# Patient Record
Sex: Female | Born: 1975 | Race: White | Hispanic: No | Marital: Married | Smoking: Never smoker
Health system: Southern US, Community
[De-identification: ages and names within clinical notes are randomized; demographics above are authoritative.]

## PROBLEM LIST (undated history)

## (undated) ENCOUNTER — Inpatient Hospital Stay (HOSPITAL_COMMUNITY): Payer: Self-pay

## (undated) DIAGNOSIS — O09529 Supervision of elderly multigravida, unspecified trimester: Secondary | ICD-10-CM

## (undated) DIAGNOSIS — F32A Depression, unspecified: Secondary | ICD-10-CM

## (undated) DIAGNOSIS — F329 Major depressive disorder, single episode, unspecified: Secondary | ICD-10-CM

## (undated) DIAGNOSIS — F419 Anxiety disorder, unspecified: Secondary | ICD-10-CM

## (undated) DIAGNOSIS — E7212 Methylenetetrahydrofolate reductase deficiency: Secondary | ICD-10-CM

## (undated) DIAGNOSIS — Z1589 Genetic susceptibility to other disease: Secondary | ICD-10-CM

## (undated) HISTORY — PX: OVARIAN CYST REMOVAL: SHX89

## (undated) HISTORY — PX: LAPAROSCOPIC ABDOMINAL EXPLORATION: SHX6249

## (undated) HISTORY — PX: FOOT SURGERY: SHX648

---

## 2009-03-25 ENCOUNTER — Inpatient Hospital Stay (HOSPITAL_COMMUNITY): Admission: AD | Admit: 2009-03-25 | Discharge: 2009-03-25 | Payer: Self-pay | Admitting: Obstetrics

## 2009-04-15 ENCOUNTER — Ambulatory Visit (HOSPITAL_COMMUNITY): Admission: AD | Admit: 2009-04-15 | Discharge: 2009-04-15 | Payer: Self-pay | Admitting: Obstetrics

## 2009-04-15 ENCOUNTER — Encounter (INDEPENDENT_AMBULATORY_CARE_PROVIDER_SITE_OTHER): Payer: Self-pay | Admitting: Obstetrics

## 2009-09-23 ENCOUNTER — Ambulatory Visit (HOSPITAL_COMMUNITY): Admission: RE | Admit: 2009-09-23 | Discharge: 2009-09-23 | Payer: Self-pay | Admitting: Obstetrics and Gynecology

## 2010-07-23 ENCOUNTER — Ambulatory Visit (HOSPITAL_COMMUNITY): Admission: RE | Admit: 2010-07-23 | Discharge: 2010-07-23 | Payer: Self-pay | Admitting: Gastroenterology

## 2011-01-04 LAB — TYPE AND SCREEN: Antibody Screen: NEGATIVE

## 2011-01-05 LAB — ABO/RH: ABO/RH(D): O POS

## 2011-02-10 NOTE — Op Note (Signed)
Haley Church, Haley Church                 ACCOUNT NO.:  [id number]   MEDICAL RECORD NO.:  [id number]          PATIENT TYPE:  AMB   LOCATION:  [facility]                          FACILITY:  [facility]   PHYSICIAN:  Lendon Colonel, MD   DATE OF BIRTH:  [birth year]   DATE OF PROCEDURE:  04/15/2009  DATE OF DISCHARGE:  04/15/2009                               OPERATIVE REPORT   PREOPERATIVE DIAGNOSIS:  A 6-week twin missed abortion.   POSTOPERATIVE DIAGNOSIS:  A 6-week twin missed abortion.   PROCEDURE:  Dilation and curettage.   SURGEON:  Lendon Colonel, MD   ASSISTANT:  None.   ANESTHESIA:  Local with sedation.   FINDINGS:  Adequate POCs, hemostasis postprocedure.   SPECIMENS:  POCs to pathology.   ANTIBIOTICS:  100 mg of IV doxycycline.   ESTIMATED BLOOD LOSS:  Minimal.   COMPLICATIONS:  None.   INDICATIONS:  This is a 35 year old G22, P1 with finding of twin demise  at 6 weeks of ultrasound in the office today.  Risks, benefits, and  alternatives to suction, D and C were discussed with the patient, the  patient elected to proceed.   PROCEDURE IN DETAIL:  After informed consent was obtained, the patient  was taken to the operating room where IV sedation was administered  without difficulty.  She was prepped and draped in the normal sterile  fashion in dorsal supine lithotomy position.  A straight cath was done  for about 200 mL of urine.  A bimanual examination was done to assess  the size and position of the uterus.  Sterile speculum was inserted into  vagina.  Solution of 1% lidocaine was used approximately 2 mL at 12  o'clock in the cervical junction, additional 4 mL each at 5 and 7  o'clock in the cervical paracervical junction.  The tenaculum was used  to grasp the anterior lip of the cervix.  Serial dilation was carried  out to a [employer] and #7-French suction curette was easily inserted  through the cervical canal into the uterus on suction, removal of  moderate amount of POCs  was done.  Two passes were done.  A sharp  curette was used to gently palpate the walls of the uterus.  Some tissue  was noted at the left posterior part of the uterus.  Suction curettage  was carried out one final pass.  Gritty texture was noted with the  suction curette on all walls of the uterus.  The procedure was then  terminated.  Silver nitrate was used on the tenaculum spot.  The patient  was given  some Toradol and some Methergine with plans to continue Methergine  postpartum.  The patient tolerated the procedure well.  Sponge, lap, and  needle counts were correct x3.  The patient was taken to the recovery  room in stable condition.      Lendon Colonel, MD  Electronically Signed     KAF/MEDQ  D:  04/15/2009  T:  04/16/2009  Job:  [id number]

## 2011-10-07 ENCOUNTER — Other Ambulatory Visit (HOSPITAL_BASED_OUTPATIENT_CLINIC_OR_DEPARTMENT_OTHER): Payer: Self-pay | Admitting: Family Medicine

## 2011-10-07 ENCOUNTER — Ambulatory Visit (HOSPITAL_BASED_OUTPATIENT_CLINIC_OR_DEPARTMENT_OTHER)
Admission: RE | Admit: 2011-10-07 | Discharge: 2011-10-07 | Disposition: A | Discharge status: Home / Self Care | Payer: Private Health Insurance - Indemnity | Source: Ambulatory Visit | Attending: Family Medicine | Admitting: Family Medicine

## 2011-10-07 DIAGNOSIS — R52 Pain, unspecified: Secondary | ICD-10-CM

## 2011-10-07 DIAGNOSIS — M25519 Pain in unspecified shoulder: Secondary | ICD-10-CM | POA: Insufficient documentation

## 2012-08-05 LAB — OB RESULTS CONSOLE RUBELLA ANTIBODY, IGM: Rubella: IMMUNE

## 2012-08-19 LAB — OB RESULTS CONSOLE GC/CHLAMYDIA
Chlamydia: NEGATIVE
Gonorrhea: NEGATIVE

## 2012-09-28 NOTE — L&D Delivery Note (Signed)
Delivery Note At 10:05 PM a viable and healthy female was delivered via Vaginal, Spontaneous Delivery (Presentation: Right Occiput Anterior).  APGAR: 9, 9; weight . 7lb 5 oz Placenta status: Intact Abnormal, Manual removal Pathology.   Uterine cavity explored with sponge  stick and manual with any palp retained tissue . Eccentric cord insertion Cord: 3 vessels with the following complications: None.  Cord pH: none  Anesthesia: Epidural  Episiotomy: None Lacerations: None Suture Repair: n/a Est. Blood Loss (mL): 300  Mom to postpartum.  Baby to nursery-stable.  [name] A 03/13/2013, 10:39 PM

## 2013-02-10 ENCOUNTER — Encounter (HOSPITAL_COMMUNITY): Payer: Self-pay

## 2013-02-10 ENCOUNTER — Inpatient Hospital Stay (HOSPITAL_COMMUNITY)
Admission: AD | Admit: 2013-02-10 | Discharge: 2013-02-10 | Disposition: A | Discharge status: Home / Self Care | Payer: 59 | Source: Ambulatory Visit | Attending: Obstetrics & Gynecology | Admitting: Obstetrics & Gynecology

## 2013-02-10 DIAGNOSIS — O99891 Other specified diseases and conditions complicating pregnancy: Secondary | ICD-10-CM | POA: Insufficient documentation

## 2013-02-10 DIAGNOSIS — R030 Elevated blood-pressure reading, without diagnosis of hypertension: Secondary | ICD-10-CM | POA: Insufficient documentation

## 2013-02-10 LAB — URINALYSIS, DIPSTICK ONLY
Bilirubin Urine: NEGATIVE
Leukocytes, UA: NEGATIVE
Nitrite: NEGATIVE
Specific Gravity, Urine: 1.025 (ref 1.005–1.030)
pH: 6 (ref 5.0–8.0)

## 2013-02-10 LAB — CBC
MCV: 88.9 fL (ref 78.0–100.0)
Platelets: 140 10*3/uL — ABNORMAL LOW (ref 150–400)
RDW: 13.9 % (ref 11.5–15.5)
WBC: 10.5 10*3/uL (ref 4.0–10.5)

## 2013-02-10 LAB — COMPREHENSIVE METABOLIC PANEL
AST: 18 U/L (ref 0–37)
Albumin: 2.8 g/dL — ABNORMAL LOW (ref 3.5–5.2)
Chloride: 104 mEq/L (ref 96–112)
Creatinine, Ser: 0.66 mg/dL (ref 0.50–1.10)
Total Bilirubin: 0.3 mg/dL (ref 0.3–1.2)

## 2013-02-10 LAB — PROTEIN / CREATININE RATIO, URINE
Creatinine, Urine: 134.6 mg/dL
Protein Creatinine Ratio: 0.1 (ref 0.00–0.15)
Total Protein, Urine: 13.9 mg/dL

## 2013-02-10 LAB — OB RESULTS CONSOLE GBS: GBS: NEGATIVE

## 2013-02-10 NOTE — MAU Note (Signed)
Was sent by MD due to elevated BP

## 2013-02-10 NOTE — MAU Note (Signed)
Patient given 24 hour container and hat with instructions on how to collect, patient to start collection Sunday morning and end Monday morning with turning this into MD office.

## 2013-02-10 NOTE — MAU Provider Note (Signed)
  History     CSN: [id number]  Arrival date and time: 02/10/13 1450   First Provider Initiated Contact with Patient 02/10/13 1621     Chief Complaint  Patient presents with  . Hypertension   HPI 37 yo, at [redacted] wks gestation, sent from office for serial BP checks and PIH labs since office BP 138/110. Pt presented for routine [facility] visit without any complaints, denies HA, vision changes, RUQ pain. Has LE swelling but resolves with rest. Overall slightly elevated BPs in 120s-occ 130/ 80s-occ 90 range though pregnancy. No h/o HTN.   No past medical history on file.  Past Surgical History  Procedure Laterality Date  . Laparoscopic abdominal exploration    . Foot surgery      No family history on file.  History  Substance Use Topics  . Smoking status: Never Smoker   . Smokeless tobacco: Not on file  . Alcohol Use: No    Allergies:  Allergies  Allergen Reactions  . Percocet (Oxycodone-Acetaminophen) Itching  . Sulfur Rash    Prescriptions prior to admission  Medication Sig Dispense Refill  . aspirin 81 MG tablet Take 81 mg by mouth daily.      . diphenhydramine-acetaminophen (TYLENOL PM) 25-500 MG TABS Take 1 tablet by mouth at bedtime as needed.      . ferrous sulfate 325 (65 FE) MG tablet Take 325 mg by mouth daily with breakfast.      . folic acid (FOLVITE) 1 MG tablet Take 1 mg by mouth daily.      . Prenatal Vit-Fe Fumarate-FA (PRENATAL MULTIVITAMIN) TABS Take 1 tablet by mouth daily at 12 noon.      . pyridoxine (B-6) 100 MG tablet Take 100 mg by mouth daily.        ROS Physical Exam   Blood pressure 138/89, pulse 101, temperature 97.7 F (36.5 C), temperature source Oral, resp. rate 18, height 5\' 9"  (1.753 m), weight 213 lb 6.4 oz (96.798 kg).  Physical Exam A&O x 3, no acute distress. Pleasant Lungs CTA bilat CV RRR, S1S2 normal Abdo soft, non tender, non acute, gravid, non tender uterus Extr no edema/ tenderness. DTR+2/+2 Pelvic deferred FHT  140s/ +  accels/ no decels/ moderate variability, REACTIVE  TOco- none   MAU Course  Procedures  Labs- Nl PIH labs, except slightly low Platelet count.  UA negative protein here.   Assessment and Plan  37 yo, at 35 wks pregnancy, elevated BPs, possible gestational HTN, needs 24 hr urine protein, will drop off in office on [place].  Strict Preeclampsia precautions/ warning s/s. FAC. Labor prec.  Dr Cherly Hensen informed.   Phebe Dettmer R 02/10/2013, 4:29 PM

## 2013-03-10 ENCOUNTER — Other Ambulatory Visit: Payer: Self-pay | Admitting: Obstetrics and Gynecology

## 2013-03-12 ENCOUNTER — Encounter (HOSPITAL_COMMUNITY): Payer: Self-pay

## 2013-03-12 ENCOUNTER — Inpatient Hospital Stay (HOSPITAL_COMMUNITY)
Admission: RE | Admit: 2013-03-12 | Discharge: 2013-03-15 | DRG: 774 | Disposition: A | Discharge status: Home / Self Care | Payer: No Typology Code available for payment source | Source: Ambulatory Visit | Attending: Obstetrics and Gynecology | Admitting: Obstetrics and Gynecology

## 2013-03-12 DIAGNOSIS — E7210 Disorders of sulfur-bearing amino-acid metabolism, unspecified: Secondary | ICD-10-CM | POA: Diagnosis present

## 2013-03-12 DIAGNOSIS — O09529 Supervision of elderly multigravida, unspecified trimester: Secondary | ICD-10-CM | POA: Diagnosis present

## 2013-03-12 DIAGNOSIS — D696 Thrombocytopenia, unspecified: Secondary | ICD-10-CM | POA: Diagnosis present

## 2013-03-12 DIAGNOSIS — D689 Coagulation defect, unspecified: Secondary | ICD-10-CM | POA: Diagnosis present

## 2013-03-12 DIAGNOSIS — O139 Gestational [pregnancy-induced] hypertension without significant proteinuria, unspecified trimester: Principal | ICD-10-CM | POA: Diagnosis present

## 2013-03-12 LAB — URIC ACID: Uric Acid, Serum: 6 mg/dL (ref 2.4–7.0)

## 2013-03-12 LAB — COMPREHENSIVE METABOLIC PANEL
ALT: 15 U/L (ref 0–35)
Alkaline Phosphatase: 182 U/L — ABNORMAL HIGH (ref 39–117)
BUN: 7 mg/dL (ref 6–23)
CO2: 21 mEq/L (ref 19–32)
Chloride: 101 mEq/L (ref 96–112)
GFR calc Af Amer: >90 mL/min (ref >90)
Glucose, Bld: 83 mg/dL (ref 70–99)
Potassium: 3.7 mEq/L (ref 3.5–5.1)
Total Bilirubin: 0.3 mg/dL (ref 0.3–1.2)

## 2013-03-12 LAB — CBC
HCT: 33.8 % — ABNORMAL LOW (ref 36.0–46.0)
Hemoglobin: 11.5 g/dL — ABNORMAL LOW (ref 12.0–15.0)
MCV: 89.9 fL (ref 78.0–100.0)
RBC: 3.76 MIL/uL — ABNORMAL LOW (ref 3.87–5.11)
RDW: 14.6 % (ref 11.5–15.5)
WBC: 11.1 10*3/uL — ABNORMAL HIGH (ref 4.0–10.5)

## 2013-03-12 MED ORDER — OXYTOCIN 10 UNIT/ML IJ SOLN: 10.0000 [IU] | Freq: Once | INTRAMUSCULAR | Status: DC

## 2013-03-12 MED ORDER — OXYTOCIN 40 UNITS IN LACTATED RINGERS INFUSION - SIMPLE MED
1.0000 m[IU]/min | INTRAVENOUS | Status: DC
  Administered 2013-03-13: 2 m[IU]/min via INTRAVENOUS
  Filled 2013-03-12: qty 1000

## 2013-03-12 MED ORDER — ZOLPIDEM TARTRATE 5 MG PO TABS
5.0000 mg | ORAL_TABLET | Freq: Every evening | ORAL | Status: DC | PRN
  Administered 2013-03-12: 5 mg via ORAL
  Filled 2013-03-12: qty 1

## 2013-03-12 MED ORDER — LIDOCAINE HCL (PF) 1 % IJ SOLN
30.0000 mL | INTRAMUSCULAR | Status: DC | PRN
  Filled 2013-03-12 (×2): qty 30

## 2013-03-12 MED ORDER — ONDANSETRON HCL 4 MG/2ML IJ SOLN
4.0000 mg | Freq: Four times a day (QID) | INTRAMUSCULAR | Status: DC | PRN
  Administered 2013-03-13: 4 mg via INTRAVENOUS
  Filled 2013-03-12: qty 2

## 2013-03-12 MED ORDER — IBUPROFEN 600 MG PO TABS
600.0000 mg | ORAL_TABLET | Freq: Four times a day (QID) | ORAL | Status: DC | PRN
  Administered 2013-03-13: 600 mg via ORAL
  Filled 2013-03-12: qty 1

## 2013-03-12 MED ORDER — LACTATED RINGERS IV SOLN
INTRAVENOUS | Status: DC
  Administered 2013-03-12 – 2013-03-13 (×2): via INTRAVENOUS
  Administered 2013-03-13 (×2): 125 mL/h via INTRAVENOUS

## 2013-03-12 MED ORDER — NALBUPHINE SYRINGE 5 MG/0.5 ML
10.0000 mg | INJECTION | INTRAMUSCULAR | Status: DC | PRN
  Administered 2013-03-13 (×2): 10 mg via INTRAVENOUS
  Filled 2013-03-12 (×3): qty 1

## 2013-03-12 MED ORDER — MISOPROSTOL 25 MCG QUARTER TABLET
25.0000 ug | ORAL_TABLET | ORAL | Status: DC | PRN
  Administered 2013-03-12 – 2013-03-13 (×4): 25 ug via VAGINAL
  Filled 2013-03-12: qty 0.25
  Filled 2013-03-12: qty 1
  Filled 2013-03-12 (×2): qty 0.25

## 2013-03-12 MED ORDER — OXYTOCIN 40 UNITS IN LACTATED RINGERS INFUSION - SIMPLE MED
62.5000 mL/h | INTRAVENOUS | Status: DC
  Administered 2013-03-13: 62.5 mL/h via INTRAVENOUS

## 2013-03-12 MED ORDER — OXYTOCIN BOLUS FROM INFUSION: 500.0000 mL | INTRAVENOUS | Status: DC

## 2013-03-12 MED ORDER — TERBUTALINE SULFATE 1 MG/ML IJ SOLN: 0.2500 mg | Freq: Once | INTRAMUSCULAR | Status: AC | PRN

## 2013-03-12 MED ORDER — LACTATED RINGERS IV SOLN: 500.0000 mL | INTRAVENOUS | Status: DC | PRN

## 2013-03-12 MED ORDER — ACETAMINOPHEN 325 MG PO TABS: 650.0000 mg | ORAL_TABLET | ORAL | Status: DC | PRN

## 2013-03-12 MED ORDER — CITRIC ACID-SODIUM CITRATE 334-500 MG/5ML PO SOLN: 30.0000 mL | ORAL | Status: DC | PRN

## 2013-03-12 MED ORDER — OXYCODONE-ACETAMINOPHEN 5-325 MG PO TABS: 1.0000 | ORAL_TABLET | ORAL | Status: DC | PRN

## 2013-03-12 NOTE — H&P (Signed)
Haley Church is a 37 y.o. female presenting for induction due to gestational HTN. (+) leg swelling denies heartburn, visual changes or headache . History OB History   Grav Para Term Preterm Abortions TAB SAB Ect Mult Living   5 1 1  3 1 2   1     SAB x 3 History reviewed. No pertinent past medical history. Past Surgical History  Procedure Laterality Date  . Laparoscopic abdominal exploration    . Foot surgery     Family History: family history is not on file. Social History:  reports that she has never smoked. She does not have any smokeless tobacco history on file. She reports that she does not drink alcohol or use illicit drugs.   Prenatal Transfer Tool  Maternal Diabetes: No Genetic Screening: Normal Maternal Ultrasounds/Referrals: Normal Fetal Ultrasounds or other Referrals:  None Maternal Substance Abuse:  No Significant Maternal Medications:  None Significant Maternal Lab Results:  Lab values include: Group B Strep negative Other Comments:  None MTHFR deficiency. 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 rate 18, height 5\' 9"  (1.753 m), weight 98.884 kg (218 lb). Maternal Exam:  Abdomen: Patient reports no abdominal tenderness. Estimated fetal weight is  7 1/2 lb.   Fetal presentation: vertex  Introitus: Normal vulva. Amniotic fluid character: not assessed.  Pelvis: adequate for delivery.   Cervix: Cervix evaluated by digital exam.     Physical Exam  Constitutional: She is oriented to person, place, and time. She appears well-developed and well-nourished.  HENT:  Head: Atraumatic.  Eyes: EOM are normal.  Neck: Neck supple.  Cardiovascular: Normal rate and regular rhythm.   Respiratory: Breath sounds normal.  GI: Soft.  Musculoskeletal: She exhibits edema.  Neurological: She is alert and oriented to  person, place, and time.  Skin: Skin is warm and dry.  Psychiatric: She has a normal mood and affect.    Prenatal labs: ABO, Rh: O/Positive/-- (11/08 0000) Antibody:  neg Rubella: Immune (11/08 0000) RPR: Nonreactive (04/04 0000)  HBsAg: Negative (11/08 0000)  HIV: Non-reactive (04/04 0000)  GBS: Negative (05/16 0000)   Assessment/Plan: Gestational HTN Term gestation AMA P) admit cytotec induction. PIH labs. Analgesic prn. Pitocin   Haley Church A 03/12/2013, 8:54 PM

## 2013-03-13 ENCOUNTER — Encounter (HOSPITAL_COMMUNITY): Payer: Self-pay | Admitting: Anesthesiology

## 2013-03-13 ENCOUNTER — Encounter (HOSPITAL_COMMUNITY): Payer: Self-pay

## 2013-03-13 ENCOUNTER — Inpatient Hospital Stay (HOSPITAL_COMMUNITY): Payer: No Typology Code available for payment source | Admitting: Anesthesiology

## 2013-03-13 LAB — CBC
HCT: 32.5 % — ABNORMAL LOW (ref 36.0–46.0)
Hemoglobin: 11.1 g/dL — ABNORMAL LOW (ref 12.0–15.0)
MCH: 30.3 pg (ref 26.0–34.0)
MCHC: 34.2 g/dL (ref 30.0–36.0)
MCV: 88.8 fL (ref 78.0–100.0)
Platelets: 125 10*3/uL — ABNORMAL LOW (ref 150–400)
Platelets: 120 10*3/uL — ABNORMAL LOW (ref 150–400)
RBC: 3.76 MIL/uL — ABNORMAL LOW (ref 3.87–5.11)
RBC: 3.66 MIL/uL — ABNORMAL LOW (ref 3.87–5.11)
RDW: 14.2 % (ref 11.5–15.5)
WBC: 12 10*3/uL — ABNORMAL HIGH (ref 4.0–10.5)
WBC: 13.9 10*3/uL — ABNORMAL HIGH (ref 4.0–10.5)

## 2013-03-13 MED ORDER — FENTANYL 2.5 MCG/ML BUPIVACAINE 1/10 % EPIDURAL INFUSION (WH - ANES)
INTRAMUSCULAR | Status: DC | PRN
  Administered 2013-03-13: 15 mL/h via EPIDURAL

## 2013-03-13 MED ORDER — PHENYLEPHRINE 40 MCG/ML (10ML) SYRINGE FOR IV PUSH (FOR BLOOD PRESSURE SUPPORT)
80.0000 ug | PREFILLED_SYRINGE | INTRAVENOUS | Status: DC | PRN
  Filled 2013-03-13: qty 5
  Filled 2013-03-13: qty 2

## 2013-03-13 MED ORDER — LIDOCAINE HCL (PF) 1 % IJ SOLN
INTRAMUSCULAR | Status: DC | PRN
  Administered 2013-03-13: 5 mL
  Administered 2013-03-13: 4 mL

## 2013-03-13 MED ORDER — DIPHENHYDRAMINE HCL 50 MG/ML IJ SOLN
12.5000 mg | INTRAMUSCULAR | Status: DC | PRN
  Administered 2013-03-13 (×2): 12.5 mg via INTRAVENOUS
  Filled 2013-03-13: qty 1

## 2013-03-13 MED ORDER — PHENYLEPHRINE 40 MCG/ML (10ML) SYRINGE FOR IV PUSH (FOR BLOOD PRESSURE SUPPORT)
80.0000 ug | PREFILLED_SYRINGE | INTRAVENOUS | Status: DC | PRN
  Filled 2013-03-13: qty 2

## 2013-03-13 MED ORDER — ACETAMINOPHEN 500 MG PO TABS
1000.0000 mg | ORAL_TABLET | Freq: Four times a day (QID) | ORAL | Status: DC | PRN
  Administered 2013-03-13: 1000 mg via ORAL
  Filled 2013-03-13: qty 2

## 2013-03-13 MED ORDER — LACTATED RINGERS IV SOLN
500.0000 mL | Freq: Once | INTRAVENOUS | Status: AC
  Administered 2013-03-13: 500 mL via INTRAVENOUS

## 2013-03-13 MED ORDER — EPHEDRINE 5 MG/ML INJ
10.0000 mg | INTRAVENOUS | Status: DC | PRN
  Filled 2013-03-13: qty 2

## 2013-03-13 MED ORDER — OXYTOCIN 40 UNITS IN LACTATED RINGERS INFUSION - SIMPLE MED
1.0000 m[IU]/min | INTRAVENOUS | Status: DC
  Administered 2013-03-13: 14 m[IU]/min via INTRAVENOUS

## 2013-03-13 MED ORDER — EPHEDRINE 5 MG/ML INJ
10.0000 mg | INTRAVENOUS | Status: DC | PRN
  Filled 2013-03-13: qty 4
  Filled 2013-03-13: qty 2

## 2013-03-13 MED ORDER — FENTANYL 2.5 MCG/ML BUPIVACAINE 1/10 % EPIDURAL INFUSION (WH - ANES)
14.0000 mL/h | INTRAMUSCULAR | Status: DC | PRN
  Administered 2013-03-13: 14 mL/h via EPIDURAL
  Filled 2013-03-13 (×2): qty 125

## 2013-03-13 NOTE — Progress Notes (Signed)
IV saline locked pt up for shower

## 2013-03-13 NOTE — Progress Notes (Signed)
S: notes  Intermittent rectal pressure  O;  T 99.7 ( ax) tracing reactive  Baseline 130 Ctx q 2-3 mins VE: fully (+) 2 station  IMP" complete Term gestation Maternal fever prob 2nd to dehydration < 12 hr AROM P) start pushing. Tylenol for fever

## 2013-03-13 NOTE — Anesthesia Procedure Notes (Signed)
Epidural Patient location during procedure: OB Start time: 03/13/2013 2:13 PM  Staffing Anesthesiologist: Terin Cragle A. Performed by: anesthesiologist   Preanesthetic Checklist Completed: patient identified, site marked, surgical consent, pre-op evaluation, timeout performed, IV checked, risks and benefits discussed and monitors and equipment checked  Epidural Patient position: sitting Prep: site prepped and draped and DuraPrep Patient monitoring: continuous pulse ox and blood pressure Approach: midline Injection technique: LOR air  Needle:  Needle type: Tuohy  Needle gauge: 17 G Needle length: 9 cm and 9 Needle insertion depth: 5 cm cm Catheter type: closed end flexible Catheter size: 19 Gauge Catheter at skin depth: 10 cm Test dose: negative and Other  Assessment Events: blood not aspirated, injection not painful, no injection resistance, negative IV test and no paresthesia  Additional Notes Patient identified. Risks and benefits discussed including failed block, incomplete  Pain control, post dural puncture headache, nerve damage, paralysis, blood pressure Changes, nausea, vomiting, reactions to medications-both toxic and allergic and post Partum back pain. All questions were answered. Patient expressed understanding and wished to proceed. Sterile technique was used throughout procedure. Epidural site was Dressed with sterile barrier dressing. No paresthesias, signs of intravascular injection Or signs of intrathecal spread were encountered.  Patient was more comfortable after the epidural was dosed. Please see RN's note for documentation of vital signs and FHR which are stable.

## 2013-03-13 NOTE — Progress Notes (Signed)
S: no complaint  O: BP 130/91 Epidural Pitocin 8 MIU  VE: 3/70/-3 asynclytic AROM clear fluid IUPC inserted  plt ct 125  Tracing: baseline 130 (+) accelx ? Ctx freq(   IMP: Latent phase Gest HTN Thrombocytopenia P) cont with pitocin, exaggerated right sims

## 2013-03-13 NOTE — Anesthesia Preprocedure Evaluation (Signed)

## 2013-03-13 NOTE — Progress Notes (Signed)
Cheryle Pieper is a 37 y.o. [id number] at [redacted]w[redacted]d by ultrasound admitted for induction of labor due to gestational HTN.  Subjective: No chief complaint on file. c/o painful ctx  Objective: BP 136/76  Pulse 91  Temp(Src) 97.7 F (36.5 C) (Axillary)  Resp 20  Ht 5\' 9"  (1.753 m)  Wt 98.884 kg (218 lb)  BMI 32.18 kg/m2      FHT:  FHR: 130 bpm, variability: moderate,  accelerations:  Present,  decelerations:  Absent UC:   irregular, every 2-4 minutes SVE:  loose 1 cm dilated, 70% effaced, -3 station Tracing: cat 1  Labs: Lab Results  Component Value Date   WBC 11.1* 03/12/2013   HGB 11.5* 03/12/2013   HCT 33.8* 03/12/2013   MCV 89.9 03/12/2013   PLT 148* 03/12/2013    Assessment / Plan: latent phase Gestational HTN Term P) epidural start pitocin rpt CBC  Anticipated MOD:  NSVD  Aricela Bertagnolli A 03/13/2013, 1:08 PM

## 2013-03-13 NOTE — Progress Notes (Signed)
Haley Church is a 37 y.o. [id number] at [redacted]w[redacted]d by ultrasound admitted for induction of labor due to gestational HTN.  Subjective: No chief complaint on file.  C/o painful ctx. S/p cytotec x3 Objective: BP 136/76  Pulse 91  Temp(Src) 97.7 F (36.5 C) (Axillary)  Resp 20  Ht 5\' 9"  (1.753 m)  Wt 98.884 kg (218 lb)  BMI 32.18 kg/m2      FHT:  FHR: 130 bpm, variability: moderate,  accelerations:  Present,  decelerations:  Absent UC:   irregular, every 2-4 minutes SVE:   1 cm dilated, 50% effaced, -3 station Tracing: reactive cat 1  Labs: Lab Results  Component Value Date   WBC 11.1* 03/12/2013   HGB 11.5* 03/12/2013   HCT 33.8* 03/12/2013   MCV 89.9 03/12/2013   PLT 148* 03/12/2013    Assessment / Plan: latent phase Gestational HTN Term P) cont with cytotec  Anticipated MOD:  NSVD  Haley Church A 03/13/2013, 1:07 PM

## 2013-03-14 LAB — CBC
HCT: 29.8 % — ABNORMAL LOW (ref 36.0–46.0)
Hemoglobin: 10.2 g/dL — ABNORMAL LOW (ref 12.0–15.0)
RDW: 14.4 % (ref 11.5–15.5)
WBC: 13.6 10*3/uL — ABNORMAL HIGH (ref 4.0–10.5)

## 2013-03-14 LAB — COMPREHENSIVE METABOLIC PANEL
ALT: 13 U/L (ref 0–35)
AST: 28 U/L (ref 0–37)
Alkaline Phosphatase: 134 U/L — ABNORMAL HIGH (ref 39–117)
CO2: 20 mEq/L (ref 19–32)
GFR calc Af Amer: >90 mL/min (ref >90)
Glucose, Bld: 117 mg/dL — ABNORMAL HIGH (ref 70–99)
Potassium: 3.4 mEq/L — ABNORMAL LOW (ref 3.5–5.1)
Sodium: 134 mEq/L — ABNORMAL LOW (ref 135–145)
Total Protein: 4.7 g/dL — ABNORMAL LOW (ref 6.0–8.3)

## 2013-03-14 LAB — URIC ACID: Uric Acid, Serum: 6.4 mg/dL (ref 2.4–7.0)

## 2013-03-14 MED ORDER — SIMETHICONE 80 MG PO CHEW: 80.0000 mg | CHEWABLE_TABLET | ORAL | Status: DC | PRN

## 2013-03-14 MED ORDER — DIPHENHYDRAMINE HCL 25 MG PO CAPS: 25.0000 mg | ORAL_CAPSULE | Freq: Four times a day (QID) | ORAL | Status: DC | PRN

## 2013-03-14 MED ORDER — TETANUS-DIPHTH-ACELL PERTUSSIS 5-2.5-18.5 LF-MCG/0.5 IM SUSP
0.5000 mL | Freq: Once | INTRAMUSCULAR | Status: AC
  Administered 2013-03-14: 0.5 mL via INTRAMUSCULAR
  Filled 2013-03-14: qty 0.5

## 2013-03-14 MED ORDER — BENZOCAINE-MENTHOL 20-0.5 % EX AERO: 1.0000 "application " | INHALATION_SPRAY | CUTANEOUS | Status: DC | PRN

## 2013-03-14 MED ORDER — FERROUS SULFATE 325 (65 FE) MG PO TABS
325.0000 mg | ORAL_TABLET | Freq: Two times a day (BID) | ORAL | Status: DC
  Administered 2013-03-14 – 2013-03-15 (×3): 325 mg via ORAL
  Filled 2013-03-14 (×3): qty 1

## 2013-03-14 MED ORDER — ONDANSETRON HCL 4 MG PO TABS: 4.0000 mg | ORAL_TABLET | ORAL | Status: DC | PRN

## 2013-03-14 MED ORDER — PRENATAL MULTIVITAMIN CH: 1.0000 | ORAL_TABLET | Freq: Every day | ORAL | Status: DC

## 2013-03-14 MED ORDER — TRAMADOL HCL 50 MG PO TABS
50.0000 mg | ORAL_TABLET | Freq: Four times a day (QID) | ORAL | Status: DC | PRN
  Administered 2013-03-14 – 2013-03-15 (×3): 50 mg via ORAL
  Filled 2013-03-14 (×3): qty 1

## 2013-03-14 MED ORDER — LANOLIN HYDROUS EX OINT: TOPICAL_OINTMENT | CUTANEOUS | Status: DC | PRN

## 2013-03-14 MED ORDER — DIBUCAINE 1 % RE OINT: 1.0000 "application " | TOPICAL_OINTMENT | RECTAL | Status: DC | PRN

## 2013-03-14 MED ORDER — IBUPROFEN 600 MG PO TABS
600.0000 mg | ORAL_TABLET | Freq: Four times a day (QID) | ORAL | Status: DC
  Administered 2013-03-14 – 2013-03-15 (×4): 600 mg via ORAL
  Filled 2013-03-14 (×5): qty 1

## 2013-03-14 MED ORDER — SENNOSIDES-DOCUSATE SODIUM 8.6-50 MG PO TABS
2.0000 | ORAL_TABLET | Freq: Every day | ORAL | Status: DC
  Administered 2013-03-14: 2 via ORAL

## 2013-03-14 MED ORDER — ZOLPIDEM TARTRATE 5 MG PO TABS: 5.0000 mg | ORAL_TABLET | Freq: Every evening | ORAL | Status: DC | PRN

## 2013-03-14 MED ORDER — ONDANSETRON HCL 4 MG/2ML IJ SOLN: 4.0000 mg | INTRAMUSCULAR | Status: DC | PRN

## 2013-03-14 MED ORDER — WITCH HAZEL-GLYCERIN EX PADS: 1.0000 "application " | MEDICATED_PAD | CUTANEOUS | Status: DC | PRN

## 2013-03-14 NOTE — Anesthesia Postprocedure Evaluation (Signed)
  Anesthesia Post-op Note  Patient: Haley Church  Procedure(s) Performed: * No procedures listed *  Patient Location: Mother/Baby  Anesthesia Type:Epidural  Level of Consciousness: awake, alert , oriented and patient cooperative  Airway and Oxygen Therapy: Patient Spontanous Breathing  Post-op Pain: mild  Post-op Assessment: Patient's Cardiovascular Status Stable, Respiratory Function Stable, No headache, No backache, No residual numbness and No residual motor weakness  Post-op Vital Signs: stable  Complications: No apparent anesthesia complications

## 2013-03-14 NOTE — Progress Notes (Signed)
PPD 1 SVD  S:  Reports feeling ok             Tolerating po/ No nausea or vomiting             Bleeding is light             Pain controlled with motrin only (hx rash with codone based meds - no problem with demerol in past)             Up ad lib / ambulatory / voiding QS  Newborn breast feeding  O:               VS: BP 124/62  Pulse 93  Temp(Src) 98.2 F (36.8 C) (Oral)  Resp 18  Ht 5\' 9"  (1.753 m)  Wt 98.884 kg (218 lb)  BMI 32.18 kg/m2  SpO2 99%   LABS:  Recent Labs  03/13/13 2310 03/14/13 0623  WBC 13.9* 13.6*  HGB 11.1* 10.2*  PLT 120* 104*                                  Physical Exam:             Alert and oriented X3  Lungs: Clear and unlabored  Heart: regular rate and rhythm / no mumurs  Abdomen: soft, non-tender, non-distended              Fundus: firm, non-tender, Ueven  Perineum: marked edema - ice pack in place  Lochia: light  Extremities: no edema, no calf pain or tenderness    A: PPD # 1 SVD   Doing well - stable status  P:  Routine post partum orders              Try Ultram for pain    Marlinda Mike CNM, MSN, [facility] 03/14/2013, 9:06 AM

## 2013-03-15 MED ORDER — TRAMADOL HCL 50 MG PO TABS: 50.0000 mg | ORAL_TABLET | Freq: Four times a day (QID) | ORAL | Status: DC | PRN

## 2013-03-15 MED ORDER — DOCUSATE SODIUM 100 MG PO CAPS: 100.0000 mg | ORAL_CAPSULE | Freq: Two times a day (BID) | ORAL | Status: DC | PRN

## 2013-03-15 NOTE — Lactation Note (Signed)
This note was copied from the chart of Girl [employer]. Lactation Consultation Note Mom breast feeding baby on the left side cradle hold when I enter room, baby gulping, mom states comfortable.  Mom states br feeding has been going well, baby cluster fed last night and mom states that staff reassured her that baby is doing well and formula was not necessary.  Mom had many questions, discussed at length: cluster feeding, how to know baby is getting enough, safe sleep, br feeding basics.  Enc mom to call lactation office if she has any concerns, and to attend the BFSG. Questions answered.    Patient Name: Girl Aslynn Brunetti OZHYQ'M Date: 03/15/2013 Reason for consult: Follow-up assessment   Maternal Data    Feeding Feeding Type: Breast Milk Feeding method: Breast Length of feed: 15 min  LATCH Score/Interventions Latch: Grasps breast easily, tongue down, lips flanged, rhythmical sucking.  Audible Swallowing: Spontaneous and intermittent  Type of Nipple: Everted at rest and after stimulation  Comfort (Breast/Nipple): Soft / non-tender     Hold (Positioning): No assistance needed to correctly position infant at breast. Intervention(s): Skin to skin  LATCH Score: 10  Lactation Tools Discussed/Used     Consult Status Consult Status: Complete    Lenard Forth 03/15/2013, 10:53 AM

## 2013-03-15 NOTE — Progress Notes (Signed)
Patient ID: Haley Church, female   DOB: [birth year], 37 y.o.   MRN: [id number] PPD # 2  Subjective: Pt reports feeling well and eager for d/c home/ Pain controlled with Ultram Tolerating po/ Voiding without problems/ No n/v Bleeding is light/ Newborn info:  Information for the patient's newborn:  Haley, Church [[id number]]  female Feeding: breast    Objective:  VS: Blood pressure 123/59, pulse 86, temperature 98.4 F (36.9 C), temperature source Oral, resp. rate 19.    Recent Labs  03/13/13 2310 03/14/13 0623  WBC 13.9* 13.6*  HGB 11.1* 10.2*  HCT 32.5* 29.8*  PLT 120* 104*    Blood type: O/Positive/-- (11/08 0000) Rubella: Immune (11/08 0000)    Physical Exam:  General:  alert, cooperative and no distress CV: Regular rate and rhythm Resp: clear Abdomen: soft, nontender, normal bowel sounds Uterine Fundus: firm, below umbilicus, nontender Perineum: intact; no edema Lochia: minimal Ext: Trace edema and Homans sign is negative, no sign of DVT    A/P: PPD # 2/ G6 P2042/ S/P: SVD Doing well and stable for discharge home RX:  Ultram 50mg  Q6 hrs prn Colace 100 mg BID prn #30 Ref x 1 WOB/GYN booklet given Routine pp visit in 6wks   Demetrius Revel, MSN, [facility] 03/15/2013, 9:54 AM

## 2013-03-15 NOTE — Discharge Summary (Signed)
Obstetric Discharge Summary Reason for Admission: G6 P 2 0 4 2 @ 39wks for IOL d/t gestHTN, MAM, MTHFR Prenatal Procedures: NST and ultrasound Intrapartum Procedures: spontaneous vaginal delivery Postpartum Procedures: none Complications-Operative and Postpartum: none Hemoglobin  Date Value Range Status  03/14/2013 10.2* 12.0 - 15.0 g/dL Final     HCT  Date Value Range Status  03/14/2013 29.8* 36.0 - 46.0 % Final    Physical Exam:  General: alert, cooperative and no distress Lochia: appropriate Uterine Fundus: firm Incision: na DVT Evaluation: No evidence of DVT seen on physical exam. Negative Homan's sign.  Discharge Diagnoses: S/P SVD @ 39wks; gestHTN resolved; hx MTHFR; AMA  Discharge Information: Date: 03/15/2013 Activity: pelvic rest Diet: routine Medications: PNV, Colace and Ultram Condition: stable Instructions: refer to practice specific booklet Discharge to: home Follow-up Information   Follow up with [place] Lapaglia A, MD In 6 weeks.   Contact information:   [address] [place] [address] [phone]       Newborn Data: Live born female on 03/13/13 Birth Weight: 7 lb 5.5 oz (3330 g) APGAR: 9, 9  Home with mother.  FISHER,JULIE K 03/15/2013, 10:02 AM

## 2013-10-03 ENCOUNTER — Ambulatory Visit (INDEPENDENT_AMBULATORY_CARE_PROVIDER_SITE_OTHER): Payer: BC Managed Care – PPO | Admitting: Sports Medicine

## 2013-10-03 ENCOUNTER — Encounter: Payer: Self-pay | Admitting: Sports Medicine

## 2013-10-03 VITALS — BP 135/89 | HR 105 | Wt 188.0 lb

## 2013-10-03 DIAGNOSIS — M25512 Pain in left shoulder: Secondary | ICD-10-CM

## 2013-10-03 DIAGNOSIS — M7550 Bursitis of unspecified shoulder: Secondary | ICD-10-CM | POA: Insufficient documentation

## 2013-10-03 DIAGNOSIS — M25519 Pain in unspecified shoulder: Secondary | ICD-10-CM

## 2013-10-03 MED ORDER — MELOXICAM 15 MG PO TABS: ORAL_TABLET | ORAL | Status: DC

## 2013-10-03 NOTE — Assessment & Plan Note (Signed)
Symptoms are likely due to rotator cuff dysfunction. Subacromial injection, Mobic, formal physical therapy. Return in one month.

## 2013-10-03 NOTE — Progress Notes (Signed)
   Subjective:    I'm seeing this patient as a consultation for:  Tandy GawJade Breeback, PA-C  CC: Left shoulder pain  HPI: This is a very pleasant 38 year old female, she comes in with a several week history of pain which he localizes over the anterior and lateral aspect of her left shoulder, worse with overhead activities, flexion and internal rotation of the shoulder. She denies any trauma however she has been doing insanity workout. Pain is moderate, persistent, no radiation.  Past medical history, Surgical history, Family history not pertinant except as noted below, Social history, Allergies, and medications have been entered into the medical record, reviewed, and no changes needed.   Review of Systems: No headache, visual changes, nausea, vomiting, diarrhea, constipation, dizziness, abdominal pain, skin rash, fevers, chills, night sweats, weight loss, swollen lymph nodes, body aches, joint swelling, muscle aches, chest pain, shortness of breath, mood changes, visual or auditory hallucinations.   Objective:   General: Well Developed, well nourished, and in no acute distress.  Neuro/Psych: Alert and oriented x3, extra-ocular muscles intact, able to move all 4 extremities, sensation grossly intact. Skin: Warm and dry, no rashes noted.  Respiratory: Not using accessory muscles, speaking in full sentences, trachea midline.  Cardiovascular: Pulses palpable, no extremity edema. Abdomen: Does not appear distended. Left Shoulder: Inspection reveals no abnormalities, atrophy or asymmetry. Palpation is normal with no tenderness over AC joint or bicipital groove. ROM is full in all planes. Rotator cuff strength normal throughout. Positive Neer's, HAWKINS, Empty Can signs. Positive speed test, negative Yergason test No labral pathology noted with negative Obrien's, negative clunk and good stability. Normal scapular function observed. No painful arc and no drop arm sign. No apprehension  sign  Procedure: Real-time Ultrasound Guided Injection of left subacromial bursa Device: GE Logiq E  Verbal informed consent obtained.  Time-out conducted.  Noted no overlying erythema, induration, or other signs of local infection.  Skin prepped in a sterile fashion.  Local anesthesia: Topical Ethyl chloride.  With sterile technique and under real time ultrasound guidance: Noted moderate bursitis, 1 cc Kenalog 40, 4 cc lidocaine injected easily.  Completed without difficulty  Pain immediately resolved suggesting accurate placement of the medication.  Advised to call if fevers/chills, erythema, induration, drainage, or persistent bleeding.  Images permanently stored and available for review in the ultrasound unit.  Impression: Technically successful ultrasound guided injection.  Impression and Recommendations:   This case required medical decision making of moderate complexity.

## 2013-10-04 ENCOUNTER — Ambulatory Visit: Payer: PRIVATE HEALTH INSURANCE | Admitting: Physician Assistant

## 2013-10-06 ENCOUNTER — Ambulatory Visit (INDEPENDENT_AMBULATORY_CARE_PROVIDER_SITE_OTHER): Payer: BC Managed Care – PPO | Admitting: Physician Assistant

## 2013-10-06 ENCOUNTER — Encounter: Payer: Self-pay | Admitting: Physician Assistant

## 2013-10-06 VITALS — BP 136/80 | HR 97 | Ht 69.0 in | Wt 188.0 lb

## 2013-10-06 DIAGNOSIS — F900 Attention-deficit hyperactivity disorder, predominantly inattentive type: Secondary | ICD-10-CM | POA: Insufficient documentation

## 2013-10-06 DIAGNOSIS — F909 Attention-deficit hyperactivity disorder, unspecified type: Secondary | ICD-10-CM

## 2013-10-06 MED ORDER — METHYLPHENIDATE HCL ER (OSM) 36 MG PO TBCR: 36.0000 mg | EXTENDED_RELEASE_TABLET | Freq: Every day | ORAL | Status: DC

## 2013-10-06 NOTE — Progress Notes (Signed)
   Subjective:    Patient ID: Haley MeuseAmber Church, female    DOB: October 07, 1975, 38 y.o.   MRN: [id number]  HPI Patient is a 38 year old white female who presents to the clinic to establish care. Patient has no ongoing past medical history. She was diagnosed with ADHD since childhood. She remains on Concerta 36 mg daily. She feels very well controlled on this dose. 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 This allows her to complete tasks around the house. [facility] and would like to get something closer to home. She denies any palpitations, anorexia or insomnia with Concerta.  Review of Systems     Objective:   Physical Exam  Constitutional: She is oriented to person, place, and time. She appears well-developed and well-nourished.  HENT:  Head: Normocephalic and atraumatic.  Cardiovascular: Normal rate, regular rhythm and normal heart sounds.   Pulmonary/Chest: Effort normal and breath sounds normal.  Neurological: She is alert and oriented to person, place, and time.  Skin: Skin is warm and dry.  Psychiatric: She has a normal mood and affect. Her behavior is normal.          Assessment & Plan:  ADHD-refilled Concerta for 3 months. Followup for complete physical and lower in 3 months.

## 2013-10-10 ENCOUNTER — Ambulatory Visit (INDEPENDENT_AMBULATORY_CARE_PROVIDER_SITE_OTHER): Payer: BC Managed Care – PPO | Admitting: Physical Therapy

## 2013-10-10 DIAGNOSIS — M25519 Pain in unspecified shoulder: Secondary | ICD-10-CM

## 2013-10-10 DIAGNOSIS — R293 Abnormal posture: Secondary | ICD-10-CM

## 2013-10-31 ENCOUNTER — Encounter: Payer: Self-pay | Admitting: Sports Medicine

## 2013-10-31 ENCOUNTER — Ambulatory Visit (INDEPENDENT_AMBULATORY_CARE_PROVIDER_SITE_OTHER): Payer: BC Managed Care – PPO | Admitting: Sports Medicine

## 2013-10-31 VITALS — BP 135/89 | HR 115 | Ht 69.0 in | Wt 182.0 lb

## 2013-10-31 DIAGNOSIS — M25512 Pain in left shoulder: Secondary | ICD-10-CM

## 2013-10-31 DIAGNOSIS — M79609 Pain in unspecified limb: Secondary | ICD-10-CM

## 2013-10-31 DIAGNOSIS — M79672 Pain in left foot: Secondary | ICD-10-CM

## 2013-10-31 DIAGNOSIS — M722 Plantar fascial fibromatosis: Secondary | ICD-10-CM | POA: Insufficient documentation

## 2013-10-31 DIAGNOSIS — M7520 Bicipital tendinitis, unspecified shoulder: Secondary | ICD-10-CM

## 2013-10-31 DIAGNOSIS — M25519 Pain in unspecified shoulder: Secondary | ICD-10-CM

## 2013-10-31 DIAGNOSIS — M7522 Bicipital tendinitis, left shoulder: Secondary | ICD-10-CM

## 2013-10-31 NOTE — Progress Notes (Signed)
Subjective:    CC: Followup  HPI: Left shoulder pain: Approximately seventy-eight % improvement after subacromial injection at the last visit. She continues to have some pain that she localizes over the acromioclavicular joint, worse with picking up her child and worse with overhead activities. Mild, persistent. No paresthesias, no new trauma.  Past medical history, Surgical history, Family history not pertinant except as noted below, Social history, Allergies, and medications have been entered into the medical record, reviewed, and no changes needed.   Review of Systems: No fevers, chills, night sweats, weight loss, chest pain, or shortness of breath.   Objective:    General: Well Developed, well nourished, and in no acute distress.  Neuro: Alert and oriented x3, extra-ocular muscles intact, sensation grossly intact.  HEENT: Normocephalic, atraumatic, pupils equal round reactive to light, neck supple, no masses, no lymphadenopathy, thyroid nonpalpable.  Skin: Warm and dry, no rashes. Cardiac: Regular rate and rhythm, no murmurs rubs or gallops, no lower extremity edema.  Respiratory: Clear to auscultation bilaterally. Not using accessory muscles, speaking in full sentences. Left Shoulder: Inspection reveals no abnormalities, atrophy or asymmetry. ROM is full in all planes. Rotator cuff strength normal throughout. Positive Neer's, Hawkin's, empty can sign. Positive cross arm test with tenderness over the acromioclavicular joint. Positive speed test, negative Yergason test. No labral pathology noted with negative Obrien's, negative clunk and good stability. Normal scapular function observed. No painful arc and no drop arm sign. No apprehension sign  Procedure: Real-time Ultrasound Guided Injection of left acromioclavicular joint Device: GE Logiq E  Verbal informed consent obtained.  Time-out conducted.  Noted no overlying erythema, induration, or other signs of local infection.    Skin prepped in a sterile fashion.  Local anesthesia: Topical Ethyl chloride.  With sterile technique and under real time ultrasound guidance:  0.5 cc Kenalog 40, 1 cc lidocaine injected easily into the acromioclavicular joint. Completed without difficulty  Pain immediately resolved suggesting accurate placement of the medication.  Advised to call if fevers/chills, erythema, induration, drainage, or persistent bleeding.  Images permanently stored and available for review in the ultrasound unit.  Impression: Technically successful ultrasound guided injection.  Procedure: Real-time Ultrasound Guided Injection of left subacromial bursa Device: GE Logiq E  Verbal informed consent obtained.  Time-out conducted.  Noted no overlying erythema, induration, or other signs of local infection.  Skin prepped in a sterile fashion.  Local anesthesia: Topical Ethyl chloride.  With sterile technique and under real time ultrasound guidance:  0.5 cc Kenalog 40, 3 cc lidocaine injected easily into the subacromial bursa. Completed without difficulty  Pain immediately resolved suggesting accurate placement of the medication.  Advised to call if fevers/chills, erythema, induration, drainage, or persistent bleeding.  Images permanently stored and available for review in the ultrasound unit.  Impression: Technically successful ultrasound guided injection.  Procedure: Real-time Ultrasound Guided Injection of left biceps tendon sheath Device: GE Logiq E  Verbal informed consent obtained.  Time-out conducted.  Noted no overlying erythema, induration, or other signs of local infection.  Skin prepped in a sterile fashion.  Local anesthesia: Topical Ethyl chloride.  With sterile technique and under real time ultrasound guidance:  1 cc Kenalog 40, 3 cc lidocaine injected easily into the biceps tendon sheath in the bicipital groove taking care to avoid intratendinous injection. Completed without difficulty  Pain  immediately resolved suggesting accurate placement of the medication.  Advised to call if fevers/chills, erythema, induration, drainage, or persistent bleeding.  Images permanently stored and available for  review in the ultrasound unit.  Impression: Technically successful ultrasound guided injection.  Impression and Recommendations:

## 2013-10-31 NOTE — Assessment & Plan Note (Signed)
Greatly improved after initial subacromial injection one month ago. She continues to have some impingement symptoms but also has pain referable to the acromioclavicular joint as well as the biceps tendon sheath with a positive cross arm test and a positive speed test. Today we blocked her subacromial bursa, acromioclavicular joint, and biceps tendon sheath under guidance. Return to see me in one month.

## 2013-10-31 NOTE — Assessment & Plan Note (Signed)
Localized under her arch and while exercising. Over-the-counter inserts were ineffective. Return to see me at my next available slot for custom orthotics.

## 2013-11-02 ENCOUNTER — Ambulatory Visit (INDEPENDENT_AMBULATORY_CARE_PROVIDER_SITE_OTHER): Payer: BC Managed Care – PPO | Admitting: Sports Medicine

## 2013-11-02 ENCOUNTER — Encounter: Payer: Self-pay | Admitting: Sports Medicine

## 2013-11-02 VITALS — BP 135/83 | HR 93 | Ht 69.0 in | Wt 183.0 lb

## 2013-11-02 DIAGNOSIS — M79672 Pain in left foot: Secondary | ICD-10-CM

## 2013-11-02 DIAGNOSIS — B351 Tinea unguium: Secondary | ICD-10-CM

## 2013-11-02 DIAGNOSIS — M79609 Pain in unspecified limb: Secondary | ICD-10-CM

## 2013-11-02 MED ORDER — TERBINAFINE HCL 250 MG PO TABS: 250.0000 mg | ORAL_TABLET | Freq: Every day | ORAL | Status: AC

## 2013-11-02 NOTE — Assessment & Plan Note (Signed)
Lamisil for 3-6 months. 

## 2013-11-02 NOTE — Assessment & Plan Note (Signed)
Orthotics as above. 

## 2013-11-02 NOTE — Progress Notes (Signed)
    Patient was fitted for a : standard, cushioned, semi-rigid orthotic. The orthotic was heated and afterward the patient stood on the orthotic blank positioned on the orthotic stand. The patient was positioned in subtalar neutral position and 10 degrees of ankle dorsiflexion in a weight bearing stance. After completion of molding, a stable base was applied to the orthotic blank. The blank was ground to a stable position for weight bearing. Size: 9 Base: Blue EVA Additional Posting and Padding: None The patient ambulated these, and they were very comfortable.  I spent 40 minutes with this patient, greater than 50% was face-to-face time counseling regarding the below diagnosis.   

## 2013-11-03 ENCOUNTER — Telehealth: Payer: Self-pay | Admitting: *Deleted

## 2013-11-03 NOTE — Telephone Encounter (Signed)
Yes appt is needed in order to document medication dose increase.

## 2013-11-03 NOTE — Telephone Encounter (Signed)
Pt notified & was transferred to scheduling. 

## 2013-11-03 NOTE — Telephone Encounter (Signed)
Pt left message stating that she thinks the strength of adhd med she's on isn't as effective as it used to be.  She wants to know if you could possibly increase it.  Need appt first?

## 2013-11-06 ENCOUNTER — Encounter: Payer: Self-pay | Admitting: Physician Assistant

## 2013-11-06 ENCOUNTER — Ambulatory Visit (INDEPENDENT_AMBULATORY_CARE_PROVIDER_SITE_OTHER): Payer: BC Managed Care – PPO | Admitting: Physician Assistant

## 2013-11-06 VITALS — BP 126/78 | HR 90 | Wt 183.0 lb

## 2013-11-06 DIAGNOSIS — F909 Attention-deficit hyperactivity disorder, unspecified type: Secondary | ICD-10-CM

## 2013-11-06 DIAGNOSIS — M7522 Bicipital tendinitis, left shoulder: Secondary | ICD-10-CM | POA: Insufficient documentation

## 2013-11-06 MED ORDER — METHYLPHENIDATE HCL ER (OSM) 54 MG PO TBCR: 54.0000 mg | EXTENDED_RELEASE_TABLET | ORAL | Status: DC

## 2013-11-06 NOTE — Progress Notes (Signed)
   Subjective:    Patient ID: Haley MeuseAmber Church, female    DOB: November 26, 1975, 38 y.o.   MRN: [id number]  HPI Patient is a 38 year old female who presents to the clinic to discuss ADHD. She feels like her current dose of Concerta 36 mg daily if not as effective as it once was. She's been on this dose for a couple of years now she just recently restarted after having a baby but thought it would be adequate. She is noticing she is not able to complete tasks when she starts how to complete. She struggles to maintain a daily activities of her house. She would like to increase and see if that helped with symptoms. Patient denies any side effects of insomnia, anorexia, palpitations.    Review of Systems     Objective:   Physical Exam  Constitutional: She is oriented to person, place, and time. She appears well-developed and well-nourished.  HENT:  Head: Normocephalic and atraumatic.  Cardiovascular: Normal rate, regular rhythm and normal heart sounds.   Pulmonary/Chest: Effort normal and breath sounds normal.  Neurological: She is alert and oriented to person, place, and time.  Skin: Skin is dry.  Psychiatric: She has a normal mood and affect. Her behavior is normal.          Assessment & Plan:  ADHD-will increase Concerta to 54 mg daily. Reminded patient of side effects and to call if she had any. Will followup in 2 months.

## 2013-11-06 NOTE — Assessment & Plan Note (Signed)
Biceps tendon sheath injection as above.

## 2013-11-27 ENCOUNTER — Ambulatory Visit (INDEPENDENT_AMBULATORY_CARE_PROVIDER_SITE_OTHER): Payer: BC Managed Care – PPO | Admitting: Physician Assistant

## 2013-11-27 ENCOUNTER — Encounter: Payer: Self-pay | Admitting: Physician Assistant

## 2013-11-27 VITALS — BP 150/94 | HR 109 | Wt 180.0 lb

## 2013-11-27 DIAGNOSIS — R21 Rash and other nonspecific skin eruption: Secondary | ICD-10-CM

## 2013-11-27 MED ORDER — TRIAMCINOLONE ACETONIDE 0.5 % EX OINT: 1.0000 "application " | TOPICAL_OINTMENT | Freq: Two times a day (BID) | CUTANEOUS | Status: DC

## 2013-11-27 NOTE — Patient Instructions (Signed)
Eczema Eczema, also called atopic dermatitis, is a skin disorder that causes inflammation of the skin. It causes a red rash and dry, scaly skin. The skin becomes very itchy. Eczema is generally worse during the cooler winter months and often improves with the warmth of summer. Eczema usually starts showing signs in infancy. Some children outgrow eczema, but it may last through adulthood.  CAUSES  The exact cause of eczema is not known, but it appears to run in families. People with eczema often have a family history of eczema, allergies, asthma, or hay fever. Eczema is not contagious. Flare-ups of the condition may be caused by:   Contact with something you are sensitive or allergic to.   Stress. SIGNS AND SYMPTOMS  Dry, scaly skin.   Red, itchy rash.   Itchiness. This may occur before the skin rash and may be very intense.  DIAGNOSIS  The diagnosis of eczema is usually made based on symptoms and medical history. TREATMENT  Eczema cannot be cured, but symptoms usually can be controlled with treatment and other strategies. A treatment plan might include:  Controlling the itching and scratching.   Use over-the-counter antihistamines as directed for itching. This is especially useful at night when the itching tends to be worse.   Use over-the-counter steroid creams as directed for itching.   Avoid scratching. Scratching makes the rash and itching worse. It may also result in a skin infection (impetigo) due to a break in the skin caused by scratching.   Keeping the skin well moisturized with creams every day. This will seal in moisture and help prevent dryness. Lotions that contain alcohol and water should be avoided because they can dry the skin.   Limiting exposure to things that you are sensitive or allergic to (allergens).   Recognizing situations that cause stress.   Developing a plan to manage stress.  HOME CARE INSTRUCTIONS   Only take over-the-counter or  prescription medicines as directed by your health care provider.   Do not use anything on the skin without checking with your health care provider.   Keep baths or showers short (5 minutes) in warm (not hot) water. Use mild cleansers for bathing. These should be unscented. You may add nonperfumed bath oil to the bath water. It is best to avoid soap and bubble bath.   Immediately after a bath or shower, when the skin is still damp, apply a moisturizing ointment to the entire body. This ointment should be a petroleum ointment. This will seal in moisture and help prevent dryness. The thicker the ointment, the better. These should be unscented.   Keep fingernails cut short. Children with eczema may need to wear soft gloves or mittens at night after applying an ointment.   Dress in clothes made of cotton or cotton blends. Dress lightly, because heat increases itching.   A child with eczema should stay away from anyone with fever blisters or cold sores. The virus that causes fever blisters (herpes simplex) can cause a serious skin infection in children with eczema. SEEK MEDICAL CARE IF:   Your itching interferes with sleep.   Your rash gets worse or is not better within 1 week after starting treatment.   You see pus or soft yellow scabs in the rash area.   You have a fever.   You have a rash flare-up after contact with someone who has fever blisters.  Document Released: 09/11/2000 Document Revised: 07/05/2013 Document Reviewed: 04/17/2013 [employer] Patient Information 2014 [employer].  

## 2013-11-28 ENCOUNTER — Ambulatory Visit: Payer: BC Managed Care – PPO | Admitting: Sports Medicine

## 2013-11-28 LAB — KOH PREP: RESULT - KOH: NONE SEEN

## 2013-11-28 NOTE — Progress Notes (Signed)
   Subjective:    Patient ID: Nelida MeuseAmber Fontan, female    DOB: [birth year], 38 y.o.   MRN: [id number]  HPI P tis a 38 yo female who presents to the clinic with 2-3 days of rash on bilateral thighs but no where else. It is itchy. No draining/ozzing or pain involved. They tend to get better with time but new bumps are popping up all the time. Not tried anything to make better. They are more itchy after shower. Never had anything like this before. No fever, chills, nausea, cough, SOB.   Review of Systems     Objective:   Physical Exam  Constitutional: She appears well-developed and well-nourished.  HENT:  Head: Normocephalic and atraumatic.  Cardiovascular: Normal rate, regular rhythm and normal heart sounds.   Skin:     Psychiatric: She has a normal mood and affect. Her behavior is normal.          Assessment & Plan:  Rash- appears to be eczema;however pt feels like it might be psoriasis and I cannot complete rule that out but I do not see any plaques or typical psoriasis like lesions. Also not typical location for psoriasis. There is some scales on top of some of lesions. Will culture for fungus. In meantime given triamcinolone BID to place on rash. If not improving call office. Discussed symptomatic care for eczema and ways to decrease reaction. Consider start zyrtec daily as well.

## 2014-01-05 ENCOUNTER — Ambulatory Visit: Payer: BC Managed Care – PPO | Admitting: Physician Assistant

## 2014-01-05 DIAGNOSIS — Z0289 Encounter for other administrative examinations: Secondary | ICD-10-CM

## 2014-01-12 ENCOUNTER — Encounter: Payer: Self-pay | Admitting: Physician Assistant

## 2014-01-12 ENCOUNTER — Ambulatory Visit (INDEPENDENT_AMBULATORY_CARE_PROVIDER_SITE_OTHER): Payer: BC Managed Care – PPO | Admitting: Physician Assistant

## 2014-01-12 VITALS — BP 138/90 | HR 96 | Wt 177.0 lb

## 2014-01-12 DIAGNOSIS — F909 Attention-deficit hyperactivity disorder, unspecified type: Secondary | ICD-10-CM

## 2014-01-12 MED ORDER — LISDEXAMFETAMINE DIMESYLATE 40 MG PO CAPS: 40.0000 mg | ORAL_CAPSULE | ORAL | Status: DC

## 2014-01-12 NOTE — Progress Notes (Signed)
   Subjective:    Patient ID: Haley Church, female    DOB: 28-May-1976, 38 y.o.   MRN: [id number]  HPI Pt presents to the clinic to follow up on ADHD. She is on concerta and does not feeling like it is controlling her focus. She feels like it wears off after 3 or so hours and then she crashes. concerta was the only stimulant so far that did not make her feel like a zombie. Her daughter tried vyvanse. She wondered if she could try.    Review of Systems     Objective:   Physical Exam  Constitutional: She is oriented to person, place, and time. She appears well-developed and well-nourished.  HENT:  Head: Normocephalic and atraumatic.  Cardiovascular: Normal rate, regular rhythm and normal heart sounds.   Pulmonary/Chest: Effort normal and breath sounds normal.  Neurological: She is alert and oriented to person, place, and time.  Psychiatric: She has a normal mood and affect. Her behavior is normal.          Assessment & Plan:  ADHD- will try vyvanse 40mg . Follow up in 1 month. Gave coupon card.

## 2014-02-12 ENCOUNTER — Other Ambulatory Visit: Payer: Self-pay | Admitting: *Deleted

## 2014-02-12 ENCOUNTER — Telehealth: Payer: Self-pay | Admitting: *Deleted

## 2014-02-12 MED ORDER — LISDEXAMFETAMINE DIMESYLATE 40 MG PO CAPS: 40.0000 mg | ORAL_CAPSULE | ORAL | Status: DC

## 2014-02-12 NOTE — Telephone Encounter (Signed)
Yes if she is doing well print off 2 scripts and give to patient.

## 2014-02-12 NOTE — Telephone Encounter (Signed)
rx printed & pt notified. 

## 2014-02-12 NOTE — Telephone Encounter (Signed)
Pt left message asking for a refill on her vyvanse.  Is this ok? Please advise

## 2014-03-16 ENCOUNTER — Encounter: Payer: Self-pay | Admitting: Physician Assistant

## 2014-03-16 ENCOUNTER — Ambulatory Visit (INDEPENDENT_AMBULATORY_CARE_PROVIDER_SITE_OTHER): Payer: BC Managed Care – PPO | Admitting: Physician Assistant

## 2014-03-16 VITALS — BP 129/87 | HR 105 | Ht 69.0 in | Wt 172.0 lb

## 2014-03-16 DIAGNOSIS — F900 Attention-deficit hyperactivity disorder, predominantly inattentive type: Secondary | ICD-10-CM

## 2014-03-16 DIAGNOSIS — F909 Attention-deficit hyperactivity disorder, unspecified type: Secondary | ICD-10-CM

## 2014-03-16 MED ORDER — LISDEXAMFETAMINE DIMESYLATE 50 MG PO CAPS: 50.0000 mg | ORAL_CAPSULE | Freq: Every day | ORAL | Status: DC

## 2014-03-18 NOTE — Progress Notes (Signed)
   Subjective:    Patient ID: Haley MeuseAmber Rabon, female    DOB: 17-Dec-1975, 38 y.o.   MRN: [id number]  HPI Pt presents to the clinic to follow up on ADHD. She just does not feel like she is at the right dose. Still having significant problems concentrating and completeling task. She denies any side effects of insomnia, palpitations, tremors. She feels the best in the morning but just seems to drop about 2 or 3.     Review of Systems  All other systems reviewed and are negative.      Objective:   Physical Exam  Constitutional: She is oriented to person, place, and time. She appears well-developed and well-nourished.  HENT:  Head: Normocephalic and atraumatic.  Cardiovascular: Regular rhythm and normal heart sounds.   Tachycardia at 105. Rechecked in office visit and was 95.  Pulmonary/Chest: Effort normal and breath sounds normal. She has no wheezes.  Neurological: She is alert and oriented to person, place, and time.  Skin: Skin is dry.  Psychiatric: She has a normal mood and affect. Her behavior is normal.          Assessment & Plan:  ADHD- try increased dose of 50mg  for 3 months. Follow up in 3 months.

## 2014-05-14 ENCOUNTER — Ambulatory Visit (INDEPENDENT_AMBULATORY_CARE_PROVIDER_SITE_OTHER): Payer: BC Managed Care – PPO | Admitting: Physician Assistant

## 2014-05-14 ENCOUNTER — Encounter: Payer: Self-pay | Admitting: Physician Assistant

## 2014-05-14 VITALS — BP 135/91 | HR 105 | Temp 98.4°F | Ht 69.0 in | Wt 166.0 lb

## 2014-05-14 DIAGNOSIS — R059 Cough, unspecified: Secondary | ICD-10-CM

## 2014-05-14 DIAGNOSIS — L723 Sebaceous cyst: Secondary | ICD-10-CM

## 2014-05-14 DIAGNOSIS — R221 Localized swelling, mass and lump, neck: Secondary | ICD-10-CM | POA: Diagnosis not present

## 2014-05-14 DIAGNOSIS — R0982 Postnasal drip: Secondary | ICD-10-CM | POA: Diagnosis not present

## 2014-05-14 DIAGNOSIS — R220 Localized swelling, mass and lump, head: Secondary | ICD-10-CM | POA: Diagnosis not present

## 2014-05-14 DIAGNOSIS — Z131 Encounter for screening for diabetes mellitus: Secondary | ICD-10-CM

## 2014-05-14 DIAGNOSIS — Z13220 Encounter for screening for lipoid disorders: Secondary | ICD-10-CM

## 2014-05-14 DIAGNOSIS — R05 Cough: Secondary | ICD-10-CM

## 2014-05-14 MED ORDER — BENZONATATE 100 MG PO CAPS: 200.0000 mg | ORAL_CAPSULE | Freq: Three times a day (TID) | ORAL | Status: DC | PRN

## 2014-05-14 MED ORDER — ALBUTEROL SULFATE (2.5 MG/3ML) 0.083% IN NEBU
2.5000 mg | INHALATION_SOLUTION | Freq: Once | RESPIRATORY_TRACT | Status: AC
  Administered 2014-05-14: 2.5 mg via RESPIRATORY_TRACT

## 2014-05-14 MED ORDER — AMOXICILLIN-POT CLAVULANATE 875-125 MG PO TABS: 1.0000 | ORAL_TABLET | Freq: Two times a day (BID) | ORAL | Status: DC

## 2014-05-14 MED ORDER — METHYLPREDNISOLONE (PAK) 4 MG PO TABS: ORAL_TABLET | ORAL | Status: DC

## 2014-05-14 NOTE — Patient Instructions (Addendum)
  Sudafed to help with Post nasal drip.  Prednisone pack.  Tessalon pearle for cough up to three times a day.   Augmenting if not improving.

## 2014-05-15 DIAGNOSIS — L723 Sebaceous cyst: Secondary | ICD-10-CM | POA: Insufficient documentation

## 2014-05-16 NOTE — Progress Notes (Signed)
   Subjective:    Patient ID: Haley Church, female    DOB: 03/07/1976, 38 y.o.   MRN: [id number]  HPI Patient presents to the clinic with main concern of cough. She has had this cough on on 2 weeks. She denies any fever, chills, sinus pressure, ear pain or sore throat. She does constantly feel like she has a lump in her throat but that has been going on for over a year. She has taken over-the-counter Mucinex and cough syrups with minimal relief from cough. She denies any shortness of breath or wheezing. Cough is nonproductive.  Patient has been worked up for a lump in throat for 4 the was sent to gastroenterologist and an ECD  was done. It was negative.   Review of Systems  All other systems reviewed and are negative.      Objective:   Physical Exam  Constitutional: She is oriented to person, place, and time. She appears well-developed and well-nourished.  HENT:  Head: Normocephalic and atraumatic.  Right Ear: External ear normal.  Left Ear: External ear normal.  Nose: Nose normal.  Mouth/Throat: Oropharynx is clear and moist.  Eyes: Conjunctivae are normal.  Neck: Normal range of motion. Neck supple.  Left-sided neck fullness noted. No other lymphadenopathy.  Cardiovascular: Regular rhythm and normal heart sounds.   Tachycardia at 105.  Pulmonary/Chest: Effort normal and breath sounds normal. She has no wheezes.  Musculoskeletal:       Arms: Neurological: She is alert and oriented to person, place, and time.          Assessment & Plan:  Cough- cough seems consistent with postnasal drip. Medrol Dosepak was given. Tessalon Perles were given for cough. Discussed Zyrtec daily. Patient aware of warning signs that cough is moving to infection. Augmentin was given for patient to try not getting better in the next 5-6 days.  Lump in throat/left-sided neck fullness-will order her thyroid levels and get neck ultrasound to evaluate for any thyroid enlargement.   Screening labs were  given to patient to have done at her convenience when she is fasting for at least 8 hours.  Sebaceous cyst-patient aware and can, at any time to have excised.

## 2014-05-18 ENCOUNTER — Other Ambulatory Visit: Payer: BC Managed Care – PPO

## 2014-05-21 ENCOUNTER — Other Ambulatory Visit: Payer: BC Managed Care – PPO

## 2014-05-22 ENCOUNTER — Ambulatory Visit (INDEPENDENT_AMBULATORY_CARE_PROVIDER_SITE_OTHER): Payer: BC Managed Care – PPO

## 2014-05-22 DIAGNOSIS — R1310 Dysphagia, unspecified: Secondary | ICD-10-CM

## 2014-05-22 DIAGNOSIS — R221 Localized swelling, mass and lump, neck: Secondary | ICD-10-CM

## 2014-05-28 ENCOUNTER — Other Ambulatory Visit: Payer: Self-pay | Admitting: Physician Assistant

## 2014-05-28 DIAGNOSIS — R221 Localized swelling, mass and lump, neck: Secondary | ICD-10-CM

## 2014-06-18 ENCOUNTER — Ambulatory Visit (INDEPENDENT_AMBULATORY_CARE_PROVIDER_SITE_OTHER): Payer: BC Managed Care – PPO | Admitting: Physician Assistant

## 2014-06-18 ENCOUNTER — Encounter: Payer: Self-pay | Admitting: Physician Assistant

## 2014-06-18 VITALS — BP 132/74 | HR 79 | Ht 69.0 in | Wt 168.0 lb

## 2014-06-18 DIAGNOSIS — F909 Attention-deficit hyperactivity disorder, unspecified type: Secondary | ICD-10-CM | POA: Diagnosis not present

## 2014-06-18 DIAGNOSIS — F900 Attention-deficit hyperactivity disorder, predominantly inattentive type: Secondary | ICD-10-CM

## 2014-06-18 DIAGNOSIS — R0982 Postnasal drip: Secondary | ICD-10-CM | POA: Diagnosis not present

## 2014-06-18 DIAGNOSIS — R220 Localized swelling, mass and lump, head: Secondary | ICD-10-CM | POA: Diagnosis not present

## 2014-06-18 DIAGNOSIS — R221 Localized swelling, mass and lump, neck: Secondary | ICD-10-CM | POA: Diagnosis not present

## 2014-06-18 DIAGNOSIS — Z3169 Encounter for other general counseling and advice on procreation: Secondary | ICD-10-CM

## 2014-06-18 MED ORDER — LISDEXAMFETAMINE DIMESYLATE 50 MG PO CAPS: 50.0000 mg | ORAL_CAPSULE | Freq: Every day | ORAL | Status: DC

## 2014-06-18 NOTE — Progress Notes (Signed)
   Subjective:    Patient ID: Haley Church, female    DOB: 28-Dec-1975, 37 y.o.   MRN: [id number]  HPI Pt presents to the clinic for 3 month ADHD refill.   ADHD- doing well and controlled. She is thinking of stopping birth control and trying to get pregnant again. She wants to know when to stop medication. It took her 2 1/2 years last time to get pregnant. No insomnia, palpatitions or stimulant side effects.   She continues to have "Lump in throat sensation". thyroid US was normal. GI work up negative. She wants to know what to do next.   Review of Systems  All other systems reviewed and are negative.      Objective:   Physical Exam  Constitutional: She is oriented to person, place, and time. She appears well-developed and well-nourished.  HENT:  Head: Normocephalic and atraumatic.  Right Ear: External ear normal.  Left Ear: External ear normal.  Nose: Nose normal.  Mouth/Throat: No oropharyngeal exudate.  Does appear to be PND on oropharynx.  TM's clear bilaterally.   Eyes: Conjunctivae are normal. Right eye exhibits no discharge. Left eye exhibits no discharge.  Neck: Normal range of motion. Neck supple.  Cardiovascular: Normal rate, regular rhythm and normal heart sounds.   Pulmonary/Chest: Effort normal and breath sounds normal. She has no wheezes.  Lymphadenopathy:    She has no cervical adenopathy.  Neurological: She is alert and oriented to person, place, and time.  Skin: Skin is dry.  Psychiatric: She has a normal mood and affect. Her behavior is normal.          Assessment & Plan:  ADHD- refilled vyvanse for 3 months. Discussed to stay on until confirmed pregnancy of if has symptoms. She does need to be off of it through pregnancy. i understand it could take a while so I would hate for you to be off medication if not needed.   Conception counseling- add folic acid  and DHA to vitamin regimen for healthy pregnancy.   Lump in throat/PND- i really feel like this  is PND. Start claritin or zyrtec daily. Consider adding flonase or nasocort to that if not improving. If still has sensation then can scheduled with ENt. Discussed natural beta glucan as well.    Spent 30 minutes with patient and greater than 50 percent of visit spent counseling pt regarding medication and pregnancy.

## 2014-06-18 NOTE — Patient Instructions (Addendum)
Beta glucan.  Claritin/zyrtec.

## 2014-07-30 ENCOUNTER — Encounter: Payer: Self-pay | Admitting: Physician Assistant

## 2014-10-02 ENCOUNTER — Ambulatory Visit (INDEPENDENT_AMBULATORY_CARE_PROVIDER_SITE_OTHER): Payer: BLUE CROSS/BLUE SHIELD | Admitting: Physician Assistant

## 2014-10-02 ENCOUNTER — Encounter: Payer: Self-pay | Admitting: Physician Assistant

## 2014-10-02 VITALS — BP 132/78 | HR 90 | Ht 69.0 in | Wt 168.0 lb

## 2014-10-02 DIAGNOSIS — F900 Attention-deficit hyperactivity disorder, predominantly inattentive type: Secondary | ICD-10-CM | POA: Diagnosis not present

## 2014-10-02 MED ORDER — LISDEXAMFETAMINE DIMESYLATE 50 MG PO CAPS: 50.0000 mg | ORAL_CAPSULE | Freq: Every day | ORAL | Status: DC

## 2014-10-02 NOTE — Progress Notes (Signed)
   Subjective:    Patient ID: Haley Church, female    DOB: 08/11/1976, 39 y.o.   MRN: [id number]  HPI Pt presents for ADHD follow up. No problems and doing great on vyvanse. No insomnia or side effects.   Review of Systems  All other systems reviewed and are negative.      Objective:   Physical Exam  Constitutional: She is oriented to person, place, and time. She appears well-developed and well-nourished.  HENT:  Head: Normocephalic and atraumatic.  Cardiovascular: Normal rate, regular rhythm and normal heart sounds.   Pulmonary/Chest: Effort normal and breath sounds normal.  Neurological: She is alert and oriented to person, place, and time.  Skin: Skin is dry.  Psychiatric: She has a normal mood and affect. Her behavior is normal.          Assessment & Plan:  ADHD- rechecked HR down to 90. Refilled for 3 months. Follow up in 3 months.

## 2014-10-10 ENCOUNTER — Ambulatory Visit (INDEPENDENT_AMBULATORY_CARE_PROVIDER_SITE_OTHER): Payer: BLUE CROSS/BLUE SHIELD

## 2014-10-10 ENCOUNTER — Encounter: Payer: Self-pay | Admitting: Physician Assistant

## 2014-10-10 ENCOUNTER — Ambulatory Visit (INDEPENDENT_AMBULATORY_CARE_PROVIDER_SITE_OTHER): Payer: BLUE CROSS/BLUE SHIELD | Admitting: Physician Assistant

## 2014-10-10 ENCOUNTER — Telehealth: Payer: Self-pay | Admitting: *Deleted

## 2014-10-10 VITALS — BP 120/76 | HR 102 | Wt 166.0 lb

## 2014-10-10 DIAGNOSIS — T1490 Injury, unspecified: Secondary | ICD-10-CM

## 2014-10-10 DIAGNOSIS — S92302A Fracture of unspecified metatarsal bone(s), left foot, initial encounter for closed fracture: Secondary | ICD-10-CM | POA: Diagnosis not present

## 2014-10-10 DIAGNOSIS — S92315A Nondisplaced fracture of first metatarsal bone, left foot, initial encounter for closed fracture: Secondary | ICD-10-CM

## 2014-10-10 DIAGNOSIS — S92309A Fracture of unspecified metatarsal bone(s), unspecified foot, initial encounter for closed fracture: Secondary | ICD-10-CM | POA: Insufficient documentation

## 2014-10-10 DIAGNOSIS — T1490XA Injury, unspecified, initial encounter: Secondary | ICD-10-CM

## 2014-10-10 DIAGNOSIS — M25572 Pain in left ankle and joints of left foot: Secondary | ICD-10-CM

## 2014-10-10 MED ORDER — MEPERIDINE HCL 50 MG PO TABS: ORAL_TABLET | ORAL | Status: DC

## 2014-10-10 MED ORDER — ONDANSETRON HCL 4 MG PO TABS: 4.0000 mg | ORAL_TABLET | Freq: Three times a day (TID) | ORAL | Status: DC | PRN

## 2014-10-10 NOTE — Patient Instructions (Signed)
Metatarsal Fracture  °with Rehab °A metatarsal fracture is a break (fracture) of one of the bones of the mid-foot (metatarsal bones). The metatarsal bones are responsible for maintaining the arch of the foot. There are three classifications of metatarsal fractures: dancer's fractures, Jones fractures, and stress fractures. A dancer's fracture is when a piece of bone is pulled off by a ligament or tendon (avulsion fracture) of the outer part of the foot (fifth metatarsal), near the joint with the ankle bones. A Jones fracture occurs in the middle of the fifth metatarsal. These fractures have limited ability to heal. A stress fracture occurs when the bone is slowly injured faster than it can repair itself. °SYMPTOMS  °· Sharp pain, especially with standing or walking. °· Tenderness, swelling, and later bruising (contusion) of the foot. °· Numbness or paralysis from swelling in the foot, causing pressure on the blood vessels or nerves (uncommon). °CAUSES  °Fractures occur when a force is placed on the bone that is greater than it can handle. Common causes of injury include: °· Direct hit (trauma) to the foot. °· Twisting injury to the foot or ankle. °· Landing on the foot and ankle in an improper position. °RISK INCRESES WITH: °· Participation in contact sports, sports that require jumping and landing, or sports in which cleats are worn and sliding occurs. °· Previous foot or ankle sprains or dislocations. °· Repeated injury to any joint in the foot. °· Poor strength and flexibility. °PREVENTION °· Warm up and stretch properly before an activity. °· Allow for adequate recovery between workouts. °· Maintain physical fitness in: °¨ Strength, flexibility, and endurance. °¨ Cardiovascular fitness. °· When participating in jumping or contact sports, protect joints with supportive devices, such as wrapped elastic bandages, tape, braces, or high-top athletic shoes. °· Wear properly fitted and padded protective  equipment. °PROGNOSIS °If treated properly, metatarsal fractures usually heal well. Jones fractures have a higher risk of the bone failing to heal (nonunion). Sometimes, surgery is needed to heal Jones fractures.  °RELATED COMPLICATIONS  °· Nonunion. °· Fracture heals in a poor position (malunion). °· Long-term (chronic) pain, stiffness, or swelling of the foot. °· Excessive bleeding in the foot or at the dislocation site, causing pressure and injury to nerves and blood vessels (rare). °· Unstable or arthritic joint following repeated injury or delayed treatment. °TREATMENT  °Treatment first involves the use of ice and medicine, to reduce pain and inflammation. If the bone fragments are out of alignment (displaced), then immediate realigning of the bones (reduction) is required. Fractures that cannot be realigned by hand, or where the bones protrude through the skin (open), may require surgery to hold the fracture in place with screws, pins, and plates. After the bones are in proper alignment, the foot and ankle must be restrained for 6 or more weeks. Restraint allows healing to occur. After restraint, it is important to perform strengthening and stretching exercises to help regain strength and a full range of motion. These exercises may be completed at home or with a therapist. A stiff-soled shoe and arch support (orthotic) may be required when first returning to sports. °MEDICATION  °· If pain medicine is needed, nonsteroidal anti-inflammatory medicines (NSAIDS), or other minor pain relievers, are often advised. °· Do not take pain medicine for 7 days before surgery. °· Only take over-the-counter or prescription medicines for pain, fever, or discomfort as directed by your caregiver. °COLD THERAPY  °Cold treatment (icing) should be applied for 10 to 15 minutes every 2 to   3 hours for inflammations and pain, and immediately after activity that aggravates your symptoms. Use ice packs or ice massage. °SEEK MEDICAL CARE  IF: °· Pain, tenderness, or swelling gets worse, despite treatment. °· You experience pain, numbness, or coldness in the foot. °· Blue, gray, or dark color appears in the toenails. °· You or your child has an oral temperature above 102° F (38.9° C). °· You have increased pain, swelling, and redness. °· You have drainage of fluids or bleeding in the affected area. °· New, unexplained symptoms develop. (Drugs used in treatment may produce side effects.) °EXERCISES °RANGE OF MOTION (ROM) AND STRETCHING EXERCISES - Metatarsal Fracture (including Jones and Dancer's Fractures) °These exercises may help you when beginning to rehabilitate your injury. Your symptoms may resolve with or without further involvement from your physician, physical therapist, or athletic trainer. While completing these exercises, remember:  °· Restoring tissue flexibility helps normal motion to return to the joints. This allows healthier, less painful movement and activity. °· An effective stretch should be held for at least 30 seconds. °A stretch should never be painful. You should only feel a gentle lengthening or release in the stretched. °RANGE OF MOTION - Dorsi/Plantar Flexion °· While sitting with your right / left knee straight, draw the top of your foot upwards, by flexing your ankle. Then reverse the motion, pointing your toes downward. °· Hold each position for __________ seconds. °· After completing your first set of exercises, repeat this exercise with your knee bent. °Repeat __________ times. Complete this exercise __________ times per day.  °RANGE OF MOTION - Ankle Alphabet °· Imagine your right / left big toe is a pen. °· Keeping your hip and knee still, write out the entire alphabet with your "pen." Make the letters as large as you can, without increasing any discomfort. °Repeat __________ times. Complete this exercise __________ times per day.  °STRETCH - Gastroc, Standing °· Place your hands on a wall. °· Extend your right / left  leg behind you, keeping the front knee somewhat bent. °· Slightly point your toes inward on your back foot. °· Keeping your right / left heel on the floor and your knee straight, shift your weight toward the wall, not allowing your back to arch. °· You should feel a gentle stretch in the right / left calf. Hold this position for __________ seconds. °Repeat __________ times. Complete this stretch __________ times per day. °STRETCH - Soleus, Standing  °· Place your hands on a wall. °· Extend your right / left leg behind you, keeping the other knee somewhat bent. °· Slightly point your toes inward on your back foot. °· Keep your right / left heel on the floor, bend your back knee, and slightly shift your weight over the back leg so that you feel a gentle stretch deep in your back calf. °· Hold this position for __________ seconds. °Repeat __________ times. Complete this stretch __________ times per day. °STRENGTHENING EXERCISES - Metatarsal Fracture (Including Jones and Dancer's Fractures) °These exercises may help you when beginning to rehabilitate your injury. They may resolve your symptoms with or without further involvement from your physician, physical therapist, or athletic trainer. While completing these exercises, remember:  °· Muscles can gain both the endurance and the strength needed for everyday activities through controlled exercises. °· Complete these exercises as instructed by your physician, physical therapist or athletic trainer. Increase the resistance and repetitions only as guided by your caregiver. °STRENGTH - Dorsiflexors °· Secure a rubber exercise   band or tubing to a fixed object (table, pole) and loop the other end around your right / left foot. °· Sit on the floor facing the fixed object. The band should be slightly tense when your foot is relaxed. °· Slowly draw your foot back toward you, using your ankle and toes. °· Hold this position for __________ seconds. Slowly release the tension in  the band and return your foot to the starting position. °Repeat __________ times. Complete this exercise __________ times per day.  °STRENGTH - Plantar-flexors  °· Sit with your right / left leg extended. Holding onto both ends of a rubber exercise band or tubing, loop it around the ball of your foot. Keep a slight tension in the band. °· Slowly push your toes away from you, pointing them downward. °· Hold this position for __________ seconds. Return slowly, controlling the tension in the band. °Repeat __________ times. Complete this exercise __________ times per day.  °STRENGTH - Plantar-flexors °· Stand with your feet shoulder width apart. Steady yourself with a wall or table, using as little support as needed. °· Keeping your weight evenly spread over the width of your feet, rise up on your toes.* °· Hold this position for __________ seconds. °Repeat __________ times. Complete this exercise __________ times per day.  °*If this is too easy, shift your weight toward your right / left leg until you feel challenged. Ultimately, you may be asked to do this exercise while standing on your right / left foot only. °STRENGTH - Towel Curls °· Sit in a chair, on a non-carpeted surface. °· Place your foot on a towel, keeping your heel on the floor. °· Pull the towel toward your heel only by curling your toes. Keep your heel on the floor. °· If instructed by your physician, physical therapist, or athletic trainer, weight may be added at the end of the towel. °Repeat __________ times. Complete this exercise __________ times per day. °STRENGTH - Ankle Eversion °· Secure one end of a rubber exercise band or tubing to a fixed object (table, pole). Loop the other end around your foot, just before your toes. °· Place your fists between your knees. This will focus your strengthening at your ankle. °· Drawing the band across your opposite foot, away from the pole, slowly, pull your little toe out and up. Make sure the band is  positioned to resist the entire motion. °· Hold this position for __________ seconds. °· Have your muscles resist the band, as it slowly pulls your foot back to the starting position. °Repeat __________ times. Complete this exercise __________ times per day.  °STRENGTH - Ankle Inversion °· Secure one end of a rubber exercise band or tubing to a fixed object (table, pole). Loop the other end around your foot, just before your toes. °· Place your fists between your knees. This will focus your strengthening at your ankle. °· Slowly, pull your big toe up and in, making sure the band is positioned to resist the entire motion. °· Hold this position for __________ seconds. °· Have your muscles resist the band, as it slowly pulls your foot back to the starting position. °Repeat __________ times. Complete this exercises __________ times per day.  °Document Released: 09/14/2005 Document Revised: 12/07/2011 Document Reviewed: 12/28/2013 °[employer]® Patient Information ©2015 [employer]. This information is not intended to replace advice given to you by your health care provider. Make sure you discuss any questions you have with your health care provider. ° °

## 2014-10-10 NOTE — Telephone Encounter (Signed)
Vyvanse prior auth initiated in cover my meds - awaiting decision

## 2014-10-11 NOTE — Progress Notes (Signed)
   Subjective:    Patient ID: Haley MeuseAmber Belt, female    DOB: 07/04/1976, 39 y.o.   MRN: [id number]  HPI Pt presents to the clinic yesterday after fall down 4 steps while carrying her young daughter. She is not quite sure what happen but notice the medial portion of left foot had hit something and was hurting. She has done nothing to make better. Pain seems to be getting worse and walking to try not to bear weight on medial portion of left foot.    Review of Systems  All other systems reviewed and are negative.      Objective:   Physical Exam  Constitutional: She is oriented to person, place, and time. She appears well-developed and well-nourished.  Musculoskeletal:  Left foot: No pain with palpation over ankle.  Normal ROM of foot and ankle.  Redness and swelling around medial  First metatarsal.   Neurological: She is alert and oriented to person, place, and time.  Psychiatric: She has a normal mood and affect. Her behavior is normal.          Assessment & Plan:  Left metatarsal fracture- xray confirmed small chip that correlates to spot of pain. Placed in post-op boot and ace wrap. Discussed heal time of 4-6 weeks. Ice for next 3 days. Pt cannot tolerate hydrocodone or oxycodone. She has tolerted demerol in past. Given demerol and zofran if she has the stomach upset. Follow up in 4 weeks. HO given for ROM and strength exercises.

## 2014-10-12 NOTE — Telephone Encounter (Signed)
Vyvanse denial received today from [employer] - given to [place].

## 2014-10-18 ENCOUNTER — Telehealth: Payer: Self-pay | Admitting: *Deleted

## 2014-10-18 NOTE — Telephone Encounter (Signed)
Resubmitted auth for vyvanse - awaiting decision

## 2014-12-17 ENCOUNTER — Encounter: Payer: Self-pay | Admitting: Physician Assistant

## 2014-12-17 ENCOUNTER — Ambulatory Visit (INDEPENDENT_AMBULATORY_CARE_PROVIDER_SITE_OTHER): Payer: BLUE CROSS/BLUE SHIELD | Admitting: Physician Assistant

## 2014-12-17 VITALS — BP 122/78 | HR 102 | Wt 165.0 lb

## 2014-12-17 DIAGNOSIS — F419 Anxiety disorder, unspecified: Secondary | ICD-10-CM

## 2014-12-17 DIAGNOSIS — F900 Attention-deficit hyperactivity disorder, predominantly inattentive type: Secondary | ICD-10-CM

## 2014-12-17 DIAGNOSIS — S92302A Fracture of unspecified metatarsal bone(s), left foot, initial encounter for closed fracture: Secondary | ICD-10-CM

## 2014-12-17 DIAGNOSIS — F411 Generalized anxiety disorder: Secondary | ICD-10-CM | POA: Insufficient documentation

## 2014-12-17 DIAGNOSIS — F430 Acute stress reaction: Secondary | ICD-10-CM

## 2014-12-17 MED ORDER — LISDEXAMFETAMINE DIMESYLATE 50 MG PO CAPS: 50.0000 mg | ORAL_CAPSULE | Freq: Every day | ORAL | Status: DC

## 2014-12-17 MED ORDER — CLONAZEPAM 0.5 MG PO TABS: 0.5000 mg | ORAL_TABLET | Freq: Two times a day (BID) | ORAL | Status: DC | PRN

## 2014-12-17 NOTE — Patient Instructions (Signed)
Metatarsal Fracture  °with Rehab °A metatarsal fracture is a break (fracture) of one of the bones of the mid-foot (metatarsal bones). The metatarsal bones are responsible for maintaining the arch of the foot. There are three classifications of metatarsal fractures: dancer's fractures, Jones fractures, and stress fractures. A dancer's fracture is when a piece of bone is pulled off by a ligament or tendon (avulsion fracture) of the outer part of the foot (fifth metatarsal), near the joint with the ankle bones. A Jones fracture occurs in the middle of the fifth metatarsal. These fractures have limited ability to heal. A stress fracture occurs when the bone is slowly injured faster than it can repair itself. °SYMPTOMS  °· Sharp pain, especially with standing or walking. °· Tenderness, swelling, and later bruising (contusion) of the foot. °· Numbness or paralysis from swelling in the foot, causing pressure on the blood vessels or nerves (uncommon). °CAUSES  °Fractures occur when a force is placed on the bone that is greater than it can handle. Common causes of injury include: °· Direct hit (trauma) to the foot. °· Twisting injury to the foot or ankle. °· Landing on the foot and ankle in an improper position. °RISK INCRESES WITH: °· Participation in contact sports, sports that require jumping and landing, or sports in which cleats are worn and sliding occurs. °· Previous foot or ankle sprains or dislocations. °· Repeated injury to any joint in the foot. °· Poor strength and flexibility. °PREVENTION °· Warm up and stretch properly before an activity. °· Allow for adequate recovery between workouts. °· Maintain physical fitness in: °¨ Strength, flexibility, and endurance. °¨ Cardiovascular fitness. °· When participating in jumping or contact sports, protect joints with supportive devices, such as wrapped elastic bandages, tape, braces, or high-top athletic shoes. °· Wear properly fitted and padded protective  equipment. °PROGNOSIS °If treated properly, metatarsal fractures usually heal well. Jones fractures have a higher risk of the bone failing to heal (nonunion). Sometimes, surgery is needed to heal Jones fractures.  °RELATED COMPLICATIONS  °· Nonunion. °· Fracture heals in a poor position (malunion). °· Long-term (chronic) pain, stiffness, or swelling of the foot. °· Excessive bleeding in the foot or at the dislocation site, causing pressure and injury to nerves and blood vessels (rare). °· Unstable or arthritic joint following repeated injury or delayed treatment. °TREATMENT  °Treatment first involves the use of ice and medicine, to reduce pain and inflammation. If the bone fragments are out of alignment (displaced), then immediate realigning of the bones (reduction) is required. Fractures that cannot be realigned by hand, or where the bones protrude through the skin (open), may require surgery to hold the fracture in place with screws, pins, and plates. After the bones are in proper alignment, the foot and ankle must be restrained for 6 or more weeks. Restraint allows healing to occur. After restraint, it is important to perform strengthening and stretching exercises to help regain strength and a full range of motion. These exercises may be completed at home or with a therapist. A stiff-soled shoe and arch support (orthotic) may be required when first returning to sports. °MEDICATION  °· If pain medicine is needed, nonsteroidal anti-inflammatory medicines (NSAIDS), or other minor pain relievers, are often advised. °· Do not take pain medicine for 7 days before surgery. °· Only take over-the-counter or prescription medicines for pain, fever, or discomfort as directed by your caregiver. °COLD THERAPY  °Cold treatment (icing) should be applied for 10 to 15 minutes every 2 to   3 hours for inflammations and pain, and immediately after activity that aggravates your symptoms. Use ice packs or ice massage. °SEEK MEDICAL CARE  IF: °· Pain, tenderness, or swelling gets worse, despite treatment. °· You experience pain, numbness, or coldness in the foot. °· Blue, gray, or dark color appears in the toenails. °· You or your child has an oral temperature above 102° F (38.9° C). °· You have increased pain, swelling, and redness. °· You have drainage of fluids or bleeding in the affected area. °· New, unexplained symptoms develop. (Drugs used in treatment may produce side effects.) °EXERCISES °RANGE OF MOTION (ROM) AND STRETCHING EXERCISES - Metatarsal Fracture (including Jones and Dancer's Fractures) °These exercises may help you when beginning to rehabilitate your injury. Your symptoms may resolve with or without further involvement from your physician, physical therapist, or athletic trainer. While completing these exercises, remember:  °· Restoring tissue flexibility helps normal motion to return to the joints. This allows healthier, less painful movement and activity. °· An effective stretch should be held for at least 30 seconds. °A stretch should never be painful. You should only feel a gentle lengthening or release in the stretched. °RANGE OF MOTION - Dorsi/Plantar Flexion °· While sitting with your right / left knee straight, draw the top of your foot upwards, by flexing your ankle. Then reverse the motion, pointing your toes downward. °· Hold each position for __________ seconds. °· After completing your first set of exercises, repeat this exercise with your knee bent. °Repeat __________ times. Complete this exercise __________ times per day.  °RANGE OF MOTION - Ankle Alphabet °· Imagine your right / left big toe is a pen. °· Keeping your hip and knee still, write out the entire alphabet with your "pen." Make the letters as large as you can, without increasing any discomfort. °Repeat __________ times. Complete this exercise __________ times per day.  °STRETCH - Gastroc, Standing °· Place your hands on a wall. °· Extend your right / left  leg behind you, keeping the front knee somewhat bent. °· Slightly point your toes inward on your back foot. °· Keeping your right / left heel on the floor and your knee straight, shift your weight toward the wall, not allowing your back to arch. °· You should feel a gentle stretch in the right / left calf. Hold this position for __________ seconds. °Repeat __________ times. Complete this stretch __________ times per day. °STRETCH - Soleus, Standing  °· Place your hands on a wall. °· Extend your right / left leg behind you, keeping the other knee somewhat bent. °· Slightly point your toes inward on your back foot. °· Keep your right / left heel on the floor, bend your back knee, and slightly shift your weight over the back leg so that you feel a gentle stretch deep in your back calf. °· Hold this position for __________ seconds. °Repeat __________ times. Complete this stretch __________ times per day. °STRENGTHENING EXERCISES - Metatarsal Fracture (Including Jones and Dancer's Fractures) °These exercises may help you when beginning to rehabilitate your injury. They may resolve your symptoms with or without further involvement from your physician, physical therapist, or athletic trainer. While completing these exercises, remember:  °· Muscles can gain both the endurance and the strength needed for everyday activities through controlled exercises. °· Complete these exercises as instructed by your physician, physical therapist or athletic trainer. Increase the resistance and repetitions only as guided by your caregiver. °STRENGTH - Dorsiflexors °· Secure a rubber exercise   band or tubing to a fixed object (table, pole) and loop the other end around your right / left foot. °· Sit on the floor facing the fixed object. The band should be slightly tense when your foot is relaxed. °· Slowly draw your foot back toward you, using your ankle and toes. °· Hold this position for __________ seconds. Slowly release the tension in  the band and return your foot to the starting position. °Repeat __________ times. Complete this exercise __________ times per day.  °STRENGTH - Plantar-flexors  °· Sit with your right / left leg extended. Holding onto both ends of a rubber exercise band or tubing, loop it around the ball of your foot. Keep a slight tension in the band. °· Slowly push your toes away from you, pointing them downward. °· Hold this position for __________ seconds. Return slowly, controlling the tension in the band. °Repeat __________ times. Complete this exercise __________ times per day.  °STRENGTH - Plantar-flexors °· Stand with your feet shoulder width apart. Steady yourself with a wall or table, using as little support as needed. °· Keeping your weight evenly spread over the width of your feet, rise up on your toes.* °· Hold this position for __________ seconds. °Repeat __________ times. Complete this exercise __________ times per day.  °*If this is too easy, shift your weight toward your right / left leg until you feel challenged. Ultimately, you may be asked to do this exercise while standing on your right / left foot only. °STRENGTH - Towel Curls °· Sit in a chair, on a non-carpeted surface. °· Place your foot on a towel, keeping your heel on the floor. °· Pull the towel toward your heel only by curling your toes. Keep your heel on the floor. °· If instructed by your physician, physical therapist, or athletic trainer, weight may be added at the end of the towel. °Repeat __________ times. Complete this exercise __________ times per day. °STRENGTH - Ankle Eversion °· Secure one end of a rubber exercise band or tubing to a fixed object (table, pole). Loop the other end around your foot, just before your toes. °· Place your fists between your knees. This will focus your strengthening at your ankle. °· Drawing the band across your opposite foot, away from the pole, slowly, pull your little toe out and up. Make sure the band is  positioned to resist the entire motion. °· Hold this position for __________ seconds. °· Have your muscles resist the band, as it slowly pulls your foot back to the starting position. °Repeat __________ times. Complete this exercise __________ times per day.  °STRENGTH - Ankle Inversion °· Secure one end of a rubber exercise band or tubing to a fixed object (table, pole). Loop the other end around your foot, just before your toes. °· Place your fists between your knees. This will focus your strengthening at your ankle. °· Slowly, pull your big toe up and in, making sure the band is positioned to resist the entire motion. °· Hold this position for __________ seconds. °· Have your muscles resist the band, as it slowly pulls your foot back to the starting position. °Repeat __________ times. Complete this exercises __________ times per day.  °Document Released: 09/14/2005 Document Revised: 12/07/2011 Document Reviewed: 12/28/2013 °[employer]® Patient Information ©2015 [employer]. This information is not intended to replace advice given to you by your health care provider. Make sure you discuss any questions you have with your health care provider. ° °

## 2014-12-17 NOTE — Progress Notes (Signed)
   Subjective:    Patient ID: Haley Church, female    DOB: 08-08-1976, 39 y.o.   MRN: [id number]  HPI  Patient is a 39 year old female who presents to the clinic with worsening anxiety. She does feel like this is situational. She has had some stuff going on in her life. Her daughter a cough for weeks ago tried to kill herself by taking too many sleeping pills. She is very overwhelmed burdened by this. Her daughter is doing better and on medication at this point. She does feel like this will get better. Her work is also very stressful at this time. She denies any suicidal or homicidal thoughts. She does continually feels herself help with panic and worry. She has been on daily anxiety agents in the past but has been able to control her anxiety with exercise and behavioral techniques. She feels like she just needs something to help with her acute anxiety for a few weeks.  ADHD-she would like to go ahead and get her medications refilled for 3 months since I will be on maternity leave. 5 minutes is working great. No problems or concerns.  Review of Systems  All other systems reviewed and are negative.      Objective:   Physical Exam  Constitutional: She is oriented to person, place, and time. She appears well-developed and well-nourished.  HENT:  Head: Normocephalic and atraumatic.  Cardiovascular: Regular rhythm and normal heart sounds.   Slightly tachycardic at 102.  Pulmonary/Chest: Effort normal and breath sounds normal.  Neurological: She is alert and oriented to person, place, and time.  Skin: Skin is dry.  Psychiatric: She has a normal mood and affect. Her behavior is normal.          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 Decided to try Klonopin one tablet as needed for more than twice a day. Had a long discussion about abuse potential. If patient finds herself needing to use this medicine more than twice a day or twice a day regularly we need to  consider a daily anxiety agent. Patient does exercise regularly and I think this will help with her anxiety. Hopefully she will only need this for acute anxiety episodes.  ADHD- refilled Vyvanse for 4 months. This should get her through my maternity leave. She has no problems concerns or complaints. She's been stable for many months on this dosage.  Metatarsal fracture left foot-I did give her some rehabilitation handouts. 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 Encouraged icing after physical activity. NSAIDs as needed.  Spent 30 minutes with patient greater than 40% of visit spent counseling patient regarding anxiety.

## 2015-03-21 ENCOUNTER — Encounter: Payer: Self-pay | Admitting: Family Medicine

## 2015-03-21 ENCOUNTER — Ambulatory Visit (INDEPENDENT_AMBULATORY_CARE_PROVIDER_SITE_OTHER): Payer: BLUE CROSS/BLUE SHIELD | Admitting: Family Medicine

## 2015-03-21 VITALS — BP 126/79 | HR 83 | Wt 170.0 lb

## 2015-03-21 DIAGNOSIS — N912 Amenorrhea, unspecified: Secondary | ICD-10-CM | POA: Diagnosis not present

## 2015-03-21 DIAGNOSIS — Z331 Pregnant state, incidental: Secondary | ICD-10-CM | POA: Diagnosis not present

## 2015-03-21 DIAGNOSIS — Z3491 Encounter for supervision of normal pregnancy, unspecified, first trimester: Secondary | ICD-10-CM

## 2015-03-21 DIAGNOSIS — Z3201 Encounter for pregnancy test, result positive: Secondary | ICD-10-CM

## 2015-03-21 NOTE — Progress Notes (Signed)
CC: Haley Church is a 39 y.o. female is here for positive pregnancy test   Subjective: HPI:  First day last menstrual period was May 9, she was expecting her menstrual cycle to start sometime in early June since it is typically very predictable however she's not had any bleeding since the menstrual cycle around May 9. She took a pregnancy test at home and it was positive. She would like to have a confirmed here. The date of Prevacid test came back positive she stopped taking Vyvanse and clonazepam. She's currently taking prenatal vitamins and only started on these earlier this week. She denies any pelvic pain, back pain or vaginal discharge. She would like to know if we can listen to her possible fetuses heartbeat. She denies any fevers, chills, nor headache.   Review Of Systems Outlined In HPI  No past medical history on file.  Past Surgical History  Procedure Laterality Date  . Laparoscopic abdominal exploration    . Foot surgery    . Ovarian cyst removal     No family history on file.  History   Social History  . Marital Status: Married    Spouse Name: N/A  . Number of Children: N/A  . Years of Education: N/A   Occupational History  . Not on file.   Social History Main Topics  . Smoking status: Never Smoker   . Smokeless tobacco: Not on file  . Alcohol Use: No  . Drug Use: No  . Sexual Activity: Yes   Other Topics Concern  . Not on file   Social History Narrative     Objective: BP 126/79 mmHg  Pulse 83  Wt 170 lb (77.111 kg)  LMP 02/05/2015  Vital signs reviewed. General: Alert and Oriented, No Acute Distress HEENT: Pupils equal, round, reactive to light. Conjunctivae clear.  External ears unremarkable.  Moist mucous membranes. Lungs: Clear and comfortable work of breathing, speaking in full sentences without accessory muscle use. Cardiac: Regular rate and rhythm.  Neuro: CN II-XII grossly intact, gait normal. Extremities: No peripheral edema.  Strong  peripheral pulses.  Mental Status: No depression, anxiety, nor agitation. Logical though process. Skin: Warm and dry.  Assessment & Plan: Haley Church was seen today for positive pregnancy test.  Diagnoses and all orders for this visit:  Positive pregnancy test Orders: -     POCT urine pregnancy  First trimester pregnancy   Pregnancy test here is positive, this is good news because she and her partner have been trying to get pregnant for a year now. Agree with stopping both Vyvanse and clonazepam. Continue taking prenatal vitamins daily. Discussed that she is probably only around 4-5 weeks of gestation and that we wouldn't be able to hear her heartbeat with the technology we have here in the clinic. She plans on using the OB/GYN office she is using the past and I encouraged her to let them know about this news so that she can begin setting up appointments with them. Discussed signs and symptoms of miscarriage which would require emergent evaluation  Return if symptoms worsen or fail to improve.

## 2015-04-08 ENCOUNTER — Other Ambulatory Visit: Payer: Self-pay | Admitting: Obstetrics and Gynecology

## 2015-04-09 ENCOUNTER — Encounter (HOSPITAL_COMMUNITY): Payer: Self-pay | Admitting: Emergency Medicine

## 2015-04-09 ENCOUNTER — Ambulatory Visit (HOSPITAL_COMMUNITY): Payer: BLUE CROSS/BLUE SHIELD | Admitting: Anesthesiology

## 2015-04-09 ENCOUNTER — Ambulatory Visit (HOSPITAL_COMMUNITY)
Admission: RE | Admit: 2015-04-09 | Discharge: 2015-04-09 | Disposition: A | Discharge status: Home / Self Care | Payer: BLUE CROSS/BLUE SHIELD | Source: Ambulatory Visit | Attending: Obstetrics and Gynecology | Admitting: Obstetrics and Gynecology

## 2015-04-09 ENCOUNTER — Encounter (HOSPITAL_COMMUNITY)
Admission: RE | Disposition: A | Discharge status: Home / Self Care | Payer: Self-pay | Source: Ambulatory Visit | Attending: Obstetrics and Gynecology

## 2015-04-09 DIAGNOSIS — Z885 Allergy status to narcotic agent status: Secondary | ICD-10-CM | POA: Diagnosis not present

## 2015-04-09 DIAGNOSIS — O020 Blighted ovum and nonhydatidiform mole: Secondary | ICD-10-CM | POA: Diagnosis present

## 2015-04-09 DIAGNOSIS — Z882 Allergy status to sulfonamides status: Secondary | ICD-10-CM | POA: Insufficient documentation

## 2015-04-09 HISTORY — PX: DILATION AND EVACUATION: SHX1459

## 2015-04-09 LAB — CBC
HCT: 39.5 % (ref 36.0–46.0)
HEMOGLOBIN: 13.6 g/dL (ref 12.0–15.0)
MCH: 30.7 pg (ref 26.0–34.0)
MCHC: 34.4 g/dL (ref 30.0–36.0)
MCV: 89.2 fL (ref 78.0–100.0)
Platelets: 244 10*3/uL (ref 150–400)
RBC: 4.43 MIL/uL (ref 3.87–5.11)
RDW: 13 % (ref 11.5–15.5)
WBC: 7.1 10*3/uL (ref 4.0–10.5)

## 2015-04-09 SURGERY — DILATION AND EVACUATION, UTERUS
Anesthesia: Monitor Anesthesia Care

## 2015-04-09 MED ORDER — KETOROLAC TROMETHAMINE 30 MG/ML IJ SOLN
INTRAMUSCULAR | Status: AC
  Filled 2015-04-09: qty 1

## 2015-04-09 MED ORDER — CHLOROPROCAINE HCL 1 % IJ SOLN
INTRAMUSCULAR | Status: AC
  Filled 2015-04-09: qty 30

## 2015-04-09 MED ORDER — PROPOFOL INFUSION 10 MG/ML OPTIME
INTRAVENOUS | Status: DC | PRN
  Administered 2015-04-09: 140 ug/kg/min via INTRAVENOUS

## 2015-04-09 MED ORDER — ONDANSETRON HCL 4 MG/2ML IJ SOLN
INTRAMUSCULAR | Status: AC
  Filled 2015-04-09: qty 2

## 2015-04-09 MED ORDER — FENTANYL CITRATE (PF) 250 MCG/5ML IJ SOLN
INTRAMUSCULAR | Status: AC
  Filled 2015-04-09: qty 5

## 2015-04-09 MED ORDER — DOXYCYCLINE HYCLATE 100 MG IV SOLR
100.0000 mg | Freq: Two times a day (BID) | INTRAVENOUS | Status: DC
  Administered 2015-04-09: 100 mg via INTRAVENOUS
  Filled 2015-04-09 (×3): qty 100

## 2015-04-09 MED ORDER — LIDOCAINE HCL (CARDIAC) 20 MG/ML IV SOLN
INTRAVENOUS | Status: AC
  Filled 2015-04-09: qty 5

## 2015-04-09 MED ORDER — LACTATED RINGERS IV SOLN
INTRAVENOUS | Status: DC
  Administered 2015-04-09 (×2): via INTRAVENOUS

## 2015-04-09 MED ORDER — LIDOCAINE HCL (CARDIAC) 20 MG/ML IV SOLN
INTRAVENOUS | Status: DC | PRN
  Administered 2015-04-09: 80 mg via INTRAVENOUS

## 2015-04-09 MED ORDER — PROPOFOL 10 MG/ML IV BOLUS
INTRAVENOUS | Status: AC
  Filled 2015-04-09: qty 20

## 2015-04-09 MED ORDER — SCOPOLAMINE 1 MG/3DAYS TD PT72
1.0000 | MEDICATED_PATCH | Freq: Once | TRANSDERMAL | Status: DC
  Administered 2015-04-09: 1.5 mg via TRANSDERMAL

## 2015-04-09 MED ORDER — MIDAZOLAM HCL 2 MG/2ML IJ SOLN
INTRAMUSCULAR | Status: AC
  Filled 2015-04-09: qty 2

## 2015-04-09 MED ORDER — SCOPOLAMINE 1 MG/3DAYS TD PT72
MEDICATED_PATCH | TRANSDERMAL | Status: AC
  Administered 2015-04-09: 1.5 mg via TRANSDERMAL
  Filled 2015-04-09: qty 1

## 2015-04-09 MED ORDER — FENTANYL CITRATE (PF) 100 MCG/2ML IJ SOLN
INTRAMUSCULAR | Status: DC | PRN
  Administered 2015-04-09 (×2): 50 ug via INTRAVENOUS

## 2015-04-09 MED ORDER — KETOROLAC TROMETHAMINE 30 MG/ML IJ SOLN
INTRAMUSCULAR | Status: DC | PRN
  Administered 2015-04-09: 30 mg via INTRAMUSCULAR
  Administered 2015-04-09: 30 mg via INTRAVENOUS

## 2015-04-09 MED ORDER — MIDAZOLAM HCL 2 MG/2ML IJ SOLN
INTRAMUSCULAR | Status: DC | PRN
  Administered 2015-04-09: 2 mg via INTRAVENOUS

## 2015-04-09 MED ORDER — IBUPROFEN 800 MG PO TABS: 800.0000 mg | ORAL_TABLET | Freq: Three times a day (TID) | ORAL | Status: DC | PRN

## 2015-04-09 MED ORDER — CHLOROPROCAINE HCL 1 % IJ SOLN
INTRAMUSCULAR | Status: DC | PRN
  Administered 2015-04-09: 20 mL

## 2015-04-09 MED ORDER — ONDANSETRON HCL 4 MG/2ML IJ SOLN
INTRAMUSCULAR | Status: DC | PRN
  Administered 2015-04-09: 4 mg via INTRAVENOUS

## 2015-04-09 MED ORDER — DEXAMETHASONE SODIUM PHOSPHATE 10 MG/ML IJ SOLN
INTRAMUSCULAR | Status: AC
  Filled 2015-04-09: qty 1

## 2015-04-09 SURGICAL SUPPLY — 18 items
CATH ROBINSON RED A/P 16FR (CATHETERS) ×3 IMPLANT
CLOTH BEACON ORANGE TIMEOUT ST (SAFETY) ×3 IMPLANT
DECANTER SPIKE VIAL GLASS SM (MISCELLANEOUS) ×3 IMPLANT
GLOVE BIOGEL PI IND STRL 7.0 (GLOVE) ×3 IMPLANT
GLOVE BIOGEL PI INDICATOR 7.0 (GLOVE) ×6
GLOVE ECLIPSE 6.5 STRL STRAW (GLOVE) ×3 IMPLANT
GOWN STRL REUS W/TWL LRG LVL3 (GOWN DISPOSABLE) ×9 IMPLANT
KIT BERKELEY 1ST TRIMESTER 3/8 (MISCELLANEOUS) ×3 IMPLANT
NS IRRIG 1000ML POUR BTL (IV SOLUTION) ×3 IMPLANT
PACK VAGINAL MINOR WOMEN LF (CUSTOM PROCEDURE TRAY) ×3 IMPLANT
PAD OB MATERNITY 4.3X12.25 (PERSONAL CARE ITEMS) ×3 IMPLANT
PAD PREP 24X48 CUFFED NSTRL (MISCELLANEOUS) ×3 IMPLANT
SET BERKELEY SUCTION TUBING (SUCTIONS) ×3 IMPLANT
TOWEL OR 17X24 6PK STRL BLUE (TOWEL DISPOSABLE) ×6 IMPLANT
VACURETTE 10 RIGID CVD (CANNULA) IMPLANT
VACURETTE 7MM CVD STRL WRAP (CANNULA) ×3 IMPLANT
VACURETTE 8 RIGID CVD (CANNULA) IMPLANT
VACURETTE 9 RIGID CVD (CANNULA) IMPLANT

## 2015-04-09 NOTE — H&P (Signed)
Haley Church is an 39 y.o. female. [id number] MWF with blighted ovum presents for surgical management. Hx recurrent pregnancy loss  Pertinent Gynecological History: Menses: flow is moderate Bleeding: reg Contraception: none DES exposure: denies Blood transfusions: none Sexually transmitted diseases: HSV Previous GYN Procedures: DNC  Last mammogram: due Date: Last pap: normal OB History: G7, P2   Menstrual History: Menarche age: n/a Patient's last menstrual period was 02/05/2015.    History reviewed. No pertinent past medical history.  Past Surgical History  Procedure Laterality Date  . Laparoscopic abdominal exploration    . Foot surgery    . Ovarian cyst removal      History reviewed. No pertinent family history.  Social History:  reports that she has never smoked. She does not have any smokeless tobacco history on file. She reports that she does not drink alcohol or use illicit drugs.  Allergies:  Allergies  Allergen Reactions  . Percocet [Oxycodone-Acetaminophen] Itching, Palpitations and Rash    Pt states she can take Acetaminophen  . Sulfa Antibiotics Hives and Rash  . Vicodin [Hydrocodone-Acetaminophen] Palpitations and Rash    Prescriptions prior to admission  Medication Sig Dispense Refill Last Dose  . acetaminophen (TYLENOL) 500 MG tablet Take 1,000 mg by mouth every 6 (six) hours as needed for mild pain, moderate pain or headache.   Past Week at Unknown time  . EPINEPHrine 0.3 mg/0.3 mL IJ SOAJ injection Inject 0.3 mg into the muscle once.   rescue  . Prenatal Vit-Fe Fumarate-FA (PRENATAL FORMULA PO) Take 1 scoop by mouth daily. Add one scoop of prenatal vitamin mix (Shakeology) to almond milk.   04/08/2015 at Unknown time  . Prenatal Vit-Fe Fumarate-FA (PRENATAL MULTIVITAMIN) TABS tablet Take 1 tablet by mouth daily at 12 noon.   04/07/2015 at Unknown time    Review of Systems  All other systems reviewed and are negative.   Last menstrual period  02/05/2015. Physical Exam  Constitutional: She is oriented to person, place, and time. She appears well-developed and well-nourished.  Eyes: EOM are normal.  Neck: Neck supple.  Cardiovascular: Regular rhythm.   Respiratory: Effort normal and breath sounds normal.  GI: Soft. Bowel sounds are normal.  Neurological: She is alert and oriented to person, place, and time.  Skin: Skin is warm and dry.  Psychiatric: She has a normal mood and affect.    No results found for this or any previous visit (from the past 24 hour(s)).  No results found.  Assessment/Plan: Recurrent preg loss Blighted ovum P) suction D&E with chromosomal analysis. Surgical risk reviewed  Including infection, bleeding uterine perforation and its risk, thermal injury, retained poc. All ? answered  Reece Fehnel A 04/09/2015, 12:47 PM

## 2015-04-09 NOTE — Anesthesia Postprocedure Evaluation (Signed)
  Anesthesia Post-op Note  Patient: Haley MeuseAmber Church  Procedure(s) Performed: Procedure(s) (LRB): DILATATION AND EVACUATION with chromosome analysis (N/A)  Patient Location: PACU  Anesthesia Type: MAC  Level of Consciousness: awake and alert   Airway and Oxygen Therapy: Patient Spontanous Breathing  Post-op Pain: mild  Post-op Assessment: Post-op Vital signs reviewed, Patient's Cardiovascular Status Stable, Respiratory Function Stable, Patent Airway and No signs of Nausea or vomiting  Last Vitals:  Filed Vitals:   04/09/15 1345  BP:   Pulse: 92  Temp: 36.8 C  Resp: 17    Post-op Vital Signs: stable   Complications: No apparent anesthesia complications

## 2015-04-09 NOTE — Anesthesia Preprocedure Evaluation (Addendum)
Anesthesia Evaluation  Patient identified by MRN, date of birth, ID band Patient awake    Reviewed: Allergy & Precautions, NPO status , Patient's Chart, lab work & pertinent test results  History of Anesthesia Complications Negative for: history of anesthetic complications  Airway Mallampati: II  TM Distance: >3 FB Neck ROM: Full    Dental no notable dental hx. (+) Dental Advisory Given   Pulmonary neg pulmonary ROS,  breath sounds clear to auscultation  Pulmonary exam normal       Cardiovascular negative cardio ROS Normal cardiovascular examRhythm:Regular Rate:Normal     Neuro/Psych PSYCHIATRIC DISORDERS Anxiety negative neurological ROS     GI/Hepatic negative GI ROS, Neg liver ROS,   Endo/Other  negative endocrine ROS  Renal/GU negative Renal ROS  negative genitourinary   Musculoskeletal negative musculoskeletal ROS (+)   Abdominal   Peds negative pediatric ROS (+)  Hematology negative hematology ROS (+)   Anesthesia Other Findings   Reproductive/Obstetrics (+) Pregnancy 6 weeks                            Anesthesia Physical Anesthesia Plan  ASA: II  Anesthesia Plan: MAC   Post-op Pain Management:    Induction: Intravenous  Airway Management Planned:   Additional Equipment:   Intra-op Plan:   Post-operative Plan: Extubation in OR  Informed Consent: I have reviewed the patients History and Physical, chart, labs and discussed the procedure including the risks, benefits and alternatives for the proposed anesthesia with the patient or authorized representative who has indicated his/her understanding and acceptance.   Dental advisory given  Plan Discussed with: CRNA  Anesthesia Plan Comments:        Anesthesia Quick Evaluation

## 2015-04-09 NOTE — Discharge Instructions (Signed)
CALL  IF TEMP>100.4, NOTHING PER VAGINA X 2 WK, CALL IF SOAKING A MAXI  PAD EVERY HOUR OR MORE FREQUENTLY 

## 2015-04-09 NOTE — Brief Op Note (Signed)
04/09/2015  2:03 PM  PATIENT:  Haley Church  39 y.o. female  PRE-OPERATIVE DIAGNOSIS:  Blighted Ovum, Recurrent Pregnancy Loss  POST-OPERATIVE DIAGNOSIS:  Blighted Ovum, Recurrent Pregnancy Loss  PROCEDURE:  Suction dilation and evacuation with chromosomal analysis  SURGEON:  Surgeon(s) and Role:    * Maxie BetterSheronette Abdulmalik Darco, MD - Primary  PHYSICIAN ASSISTANT:   ASSISTANTS: none   ANESTHESIA:   none FINDINGS; av uterus, no adnexal mass EBL:  Total I/O In: 1100 [I.V.:1100] Out: 70 [Urine:20; Blood:50]  BLOOD ADMINISTERED:none  DRAINS: none   LOCAL MEDICATIONS USED:  OTHER nesicaine  SPECIMEN:  Source of Specimen:  POC, chromosomal studies  DISPOSITION OF SPECIMEN:  PATHOLOGY  COUNTS:  YES  TOURNIQUET:  * No tourniquets in log *  DICTATION: .Other Dictation: Dictation Number [id number]  PLAN OF CARE: Discharge to home after PACU  PATIENT DISPOSITION:  PACU - hemodynamically stable.   Delay start of Pharmacological VTE agent (>24hrs) due to surgical blood loss or risk of bleeding: no

## 2015-04-09 NOTE — Transfer of Care (Signed)
Immediate Anesthesia Transfer of Care Note  Patient: Haley Church  Procedure(s) Performed: Procedure(s): DILATATION AND EVACUATION with chromosome analysis (N/A)  Patient Location: PACU  Anesthesia Type:MAC  Level of Consciousness: awake, alert , oriented and patient cooperative  Airway & Oxygen Therapy: Patient Spontanous Breathing  Post-op Assessment: Report given to RN and Post -op Vital signs reviewed and stable  Post vital signs: Reviewed and stable  Last Vitals:  Filed Vitals:   04/09/15 1304  BP: 147/82  Pulse: 88  Temp: 36.9 C  Resp: 16    Complications: No apparent anesthesia complications

## 2015-04-10 ENCOUNTER — Encounter (HOSPITAL_COMMUNITY): Payer: Self-pay | Admitting: Obstetrics and Gynecology

## 2015-04-10 NOTE — Op Note (Signed)
[name]:  Haley Church, Haley Church                 ACCOUNT NO.:  [id number]  MEDICAL RECORD NO.:  [id number]  LOCATION:  [facility]                          FACILITY:  [facility]  PHYSICIAN:  Maxie BetterSheronette Runette Scifres, M.D.DATE OF BIRTH:  15-May-1976  DATE OF PROCEDURE:  04/09/2015 DATE OF DISCHARGE:  04/09/2015                              OPERATIVE REPORT   PREOPERATIVE DIAGNOSIS:  Blighted ovum, recurrent pregnancy loss.  POSTOPERATIVE DIAGNOSIS:  Blighted ovum, recurrent pregnancy loss.  PROCEDURE:  Suction dilation, and evacuation.  ANESTHESIA:  MAC, paracervical block.  SURGEON:  Maxie BetterSheronette Santana Gosdin, M.D.  ASSISTANT:  None.  DESCRIPTION OF PROCEDURE:  Under adequate monitored anesthesia, the patient was placed in the dorsal lithotomy position.  She was sterilely prepped and draped in usual fashion.  The bladder was catheterized, some scant amount of urine.  Examination under anesthesia revealed about 7 weeks anteflexed uterus.  No adnexal masses could be appreciated.  A bivalve speculum was placed in the vagina.  20 mL of 1% Nesacaine was injected paracervically at 3 and 9 o'clock positions.  The anterior lip of the cervix was grasped with a single-tooth tenaculum.  Cervix was then easily dilated up to #25 [facility] dilator.  A #7 curved suction cannula was introduced into the uterine cavity.  Moderate amount of tissue was obtained.  The cavity was suctioned and then curetted.  All tissue was then felt to be removed, at which time, all instruments and were removed from the vagina.  SPECIMEN:  Labeled products of conception as well as for chromosomal analysis, sent to Pathology.  ESTIMATED BLOOD LOSS:  Minimal.  COMPLICATION:  None.  DISPOSITION:  The patient tolerated the procedure well and was transferred to recovery in stable condition.     Maxie BetterSheronette Luvada Salamone, M.D.    [place]/MEDQ  D:  04/09/2015  T:  04/10/2015  Job:  [id number]

## 2015-04-19 ENCOUNTER — Ambulatory Visit (INDEPENDENT_AMBULATORY_CARE_PROVIDER_SITE_OTHER): Payer: BLUE CROSS/BLUE SHIELD | Admitting: Family Medicine

## 2015-04-19 ENCOUNTER — Encounter: Payer: Self-pay | Admitting: Family Medicine

## 2015-04-19 VITALS — BP 115/71 | HR 76 | Ht 69.0 in | Wt 172.0 lb

## 2015-04-19 DIAGNOSIS — F900 Attention-deficit hyperactivity disorder, predominantly inattentive type: Secondary | ICD-10-CM | POA: Diagnosis not present

## 2015-04-19 MED ORDER — AMPHETAMINE-DEXTROAMPHET ER 20 MG PO CP24: 20.0000 mg | ORAL_CAPSULE | Freq: Every day | ORAL | Status: DC

## 2015-04-19 NOTE — Progress Notes (Signed)
   Subjective:    Patient ID: Haley Church, female    DOB: [birth year], 39 y.o.   MRN: [id number]  HPI Follow-up ADHD-patient was last seen about 3 months ago by Lesly Rubenstein her primary care provider. She refilled her Vyvanse for 4 months until she came back from maternity leave. She has actually been on this medication since April 2015. But she comes in today saying that the Vyvanse is causing some side effects. She was pregnant and had a miscarriage recently so was off the medication for awhile.  Says feels like it was making her depresed and unmotivated. She notices a big difference when she's not on the medication. She definitely feels like it's related. She says she has suffered from episodes of depression in the past. No CP , palpitations on the medication.   Review of Systems     Objective:   Physical Exam  Constitutional: She is oriented to person, place, and time. She appears well-developed and well-nourished.  HENT:  Head: Normocephalic and atraumatic.  Cardiovascular: Normal rate, regular rhythm and normal heart sounds.   Pulmonary/Chest: Effort normal and breath sounds normal.  Neurological: She is alert and oriented to person, place, and time.  Skin: Skin is warm and dry.  Psychiatric: She has a normal mood and affect. Her behavior is normal.          Assessment & Plan:  ADHD -  Discussed options. Will change to Adderrall . Recommend follow-up in one month with [name] to make sure that she's doing well and adjust the dose if needed. And to recheck her blood pressure. Certainly if symptoms persist then please let us know as it may be more mood related.

## 2015-04-20 ENCOUNTER — Emergency Department (INDEPENDENT_AMBULATORY_CARE_PROVIDER_SITE_OTHER)
Admission: EM | Admit: 2015-04-20 | Discharge: 2015-04-20 | Disposition: A | Discharge status: Home / Self Care | Payer: BLUE CROSS/BLUE SHIELD | Source: Home / Self Care | Attending: Family Medicine | Admitting: Family Medicine

## 2015-04-20 ENCOUNTER — Emergency Department (INDEPENDENT_AMBULATORY_CARE_PROVIDER_SITE_OTHER): Payer: BLUE CROSS/BLUE SHIELD

## 2015-04-20 ENCOUNTER — Encounter: Payer: Self-pay | Admitting: Emergency Medicine

## 2015-04-20 DIAGNOSIS — M79671 Pain in right foot: Secondary | ICD-10-CM | POA: Diagnosis not present

## 2015-04-20 DIAGNOSIS — M722 Plantar fascial fibromatosis: Secondary | ICD-10-CM

## 2015-04-20 MED ORDER — MELOXICAM 15 MG PO TABS: 15.0000 mg | ORAL_TABLET | Freq: Every day | ORAL | Status: DC

## 2015-04-20 NOTE — ED Provider Notes (Signed)
CSN: [id number]     Arrival date & time 04/20/15  0909 History   First MD Initiated Contact with Patient 04/20/15 [id number]     Chief Complaint  Patient presents with  . Foot Pain      HPI Comments: Patient states that she was climbing stairs yesterday and developed sudden pain in her right heel as she planted her foot on one of the stair treads.  The pain has persisted and is worse with weight bearing.  No recent injury, change in physical activities, or new shoes.  No past history of plantar fasciitis.  She states that she has a pair of custom arch supports, but has not been using them.  Patient is a 39 y.o. female presenting with lower extremity pain. The history is provided by the patient.  Foot Pain This is a new problem. The current episode started yesterday. The problem occurs constantly. The problem has not changed since onset.The symptoms are aggravated by walking. Nothing relieves the symptoms. She has tried nothing for the symptoms.    History reviewed. No pertinent past medical history. Past Surgical History  Procedure Laterality Date  . Laparoscopic abdominal exploration    . Foot surgery    . Ovarian cyst removal    . Dilation and evacuation N/A 04/09/2015    Procedure: DILATATION AND EVACUATION with chromosome analysis;  Surgeon: Maxie Better, MD;  Location: [facility];  Service: Gynecology;  Laterality: N/A;   History reviewed. No pertinent family history. History  Substance Use Topics  . Smoking status: Never Smoker   . Smokeless tobacco: Not on file  . Alcohol Use: No   OB History    Gravida Para Term Preterm AB TAB SAB Ectopic Multiple Living   Review of Systems  All other systems reviewed and are negative.   Allergies  Percocet; Sulfa antibiotics; and Vicodin  Home Medications   Prior to Admission medications   Medication Sig Start Date End Date Taking? Authorizing Provider  acetaminophen (TYLENOL) 500 MG tablet Take 1,000 mg by mouth  every 6 (six) hours as needed for mild pain, moderate pain or headache.    Historical Provider, MD  amphetamine-dextroamphetamine (ADDERALL XR) 20 MG 24 hr capsule Take 1 capsule (20 mg total) by mouth daily. 04/19/15   Agapito Games, MD  ibuprofen (ADVIL,MOTRIN) 800 MG tablet Take 1 tablet (800 mg total) by mouth every 8 (eight) hours as needed for moderate pain. 04/09/15   Maxie Better, MD  meloxicam (MOBIC) 15 MG tablet Take 1 tablet (15 mg total) by mouth daily. Take with food each morning 04/20/15   Lattie Haw, MD   BP 117/80 mmHg  Pulse 97  Temp(Src) 98.3 F (36.8 C) (Oral)  Resp 16  Ht  (1.753 m)  Wt 170 lb (77.111 kg)  BMI 25.09 kg/m2  SpO2 99%  LMP 02/05/2015 (Approximate) Physical Exam  Constitutional: She is oriented to person, place, and time. She appears well-developed and well-nourished. No distress.  HENT:  Head: Normocephalic.  Eyes: Pupils are equal, round, and reactive to light.  Pulmonary/Chest: No respiratory distress.  Abdominal: She exhibits no distension.  Musculoskeletal: She exhibits no edema.       Right foot: There is tenderness. There is normal range of motion and no swelling.  Right heel has distinct point tenderness over the mid-plantar surface.  No swelling or ecchymosis.  Neurological: She is alert and oriented to  person, place, and time.  Skin: Skin is warm and dry.  Nursing note and vitals reviewed.   ED Course  Procedures  none  Imaging Review Dg Foot Complete Right  04/20/2015   CLINICAL DATA:  Injury to right heel with pain.  Initial encounter.  EXAM: RIGHT FOOT COMPLETE - 3+ VIEW  COMPARISON:  None.  FINDINGS: No fracture or dislocation is identified. Changes are seen related to prior right first metatarsal osteotomy. No significant degenerative changes or bony lesions. Soft tissues are unremarkable.  IMPRESSION: No acute fracture.   Electronically Signed   By: Irish Lack M.D.   On: 04/20/2015 09:42     MDM   1.  Plantar fasciitis of right foot vs contusion to heel   Begin Mobic 15mg  daily. Apply ice pack for 15 to 20 minutes, 3 to 4 times daily  Continue until pain decreases.  Resume wearing custom arch supports.  May add Tuli heel cups.  Begin range of motion and stretching exercises. Followup with Dr. Rodney [facility] ([facility]) if not improving about two weeks.     Lattie Haw, MD 04/21/15 2114

## 2015-04-20 NOTE — Discharge Instructions (Signed)
Apply ice pack for 15 to 20 minutes, 3 to 4 times daily  Continue until pain decreases.  Resume wearing custom arch supports.  May add Tuli heel cups.  Begin range of motion and stretching exercises.   Plantar Fasciitis Plantar fasciitis is a common condition that causes foot pain. It is soreness (inflammation) of the band of tough fibrous tissue on the bottom of the foot that runs from the heel bone (calcaneus) to the ball of the foot. The cause of this soreness may be from excessive standing, poor fitting shoes, running on hard surfaces, being overweight, having an abnormal walk, or overuse (this is common in runners) of the painful foot or feet. It is also common in aerobic exercise dancers and ballet dancers. SYMPTOMS  Most people with plantar fasciitis complain of:  Severe pain in the morning on the bottom of their foot especially when taking the first steps out of bed. This pain recedes after a few minutes of walking.  Severe pain is experienced also during walking following a long period of inactivity.  Pain is worse when walking barefoot or up stairs DIAGNOSIS   Your caregiver will diagnose this condition by examining and feeling your foot.  Special tests such as X-rays of your foot, are usually not needed. PREVENTION   Consult a sports medicine professional before beginning a new exercise program.  Walking programs offer a good workout. With walking there is a lower chance of overuse injuries common to runners. There is less impact and less jarring of the joints.  Begin all new exercise programs slowly. If problems or pain develop, decrease the amount of time or distance until you are at a comfortable level.  Wear good shoes and replace them regularly.  Stretch your foot and the heel cords at the back of the ankle (Achilles tendon) both before and after exercise.  Run or exercise on even surfaces that are not hard. For example, asphalt is better than pavement.  Do not run  barefoot on hard surfaces.  If using a treadmill, vary the incline.  Do not continue to workout if you have foot or joint problems. Seek professional help if they do not improve. HOME CARE INSTRUCTIONS   Avoid activities that cause you pain until you recover.  Use ice or cold packs on the problem or painful areas after working out.  Only take over-the-counter or prescription medicines for pain, discomfort, or fever as directed by your caregiver.  Soft shoe inserts or athletic shoes with air or gel sole cushions may be helpful.  If problems continue or become more severe, consult a sports medicine caregiver or your own health care provider. Cortisone is a potent anti-inflammatory medication that may be injected into the painful area. You can discuss this treatment with your caregiver. MAKE SURE YOU:   Understand these instructions.  Will watch your condition.  Will get help right away if you are not doing well or get worse. Document Released: 06/09/2001 Document Revised: 12/07/2011 Document Reviewed: 08/08/2008 [facility] Patient Information 2015 [place], [place]. This information is not intended to replace advice given to you by your health care provider. Make sure you discuss any questions you have with your health care provider.

## 2015-04-20 NOTE — ED Notes (Signed)
Fell last night and hurt right heel; cannot bear weight on it. No OTC today.

## 2015-04-26 LAB — CHROMOSOME STD, POC(TISSUE)-NCBH

## 2015-05-20 ENCOUNTER — Ambulatory Visit (INDEPENDENT_AMBULATORY_CARE_PROVIDER_SITE_OTHER): Payer: BLUE CROSS/BLUE SHIELD | Admitting: Physician Assistant

## 2015-05-20 ENCOUNTER — Encounter: Payer: Self-pay | Admitting: Physician Assistant

## 2015-05-20 VITALS — BP 105/70 | HR 81 | Ht 69.0 in | Wt 164.0 lb

## 2015-05-20 DIAGNOSIS — F900 Attention-deficit hyperactivity disorder, predominantly inattentive type: Secondary | ICD-10-CM

## 2015-05-20 LAB — HM MAMMOGRAPHY

## 2015-05-20 MED ORDER — AMPHETAMINE-DEXTROAMPHET ER 30 MG PO CP24: 30.0000 mg | ORAL_CAPSULE | ORAL | Status: DC

## 2015-05-20 NOTE — Progress Notes (Signed)
   Subjective:    Patient ID: Haley Church, female    DOB: 1976/03/25, 39 y.o.   MRN: [id number]  HPI Pt presents to the clinic with 1 month follow up after switching from vyvanse to adderall XR. She does like adderall XR much better. She does not feel the mood shift with adderall. She recently has gone through a very stressful time miscarriage but feels like her anxiety and depression is better. She is back to exercising and eating better. She does feel like adderall wears off about 2 oclock in afternoon. She would like for it to last longer. No CP/palpitations/anorexia/insomnia.   Review of Systems  All other systems reviewed and are negative.      Objective:   Physical Exam  Constitutional: She is oriented to person, place, and time. She appears well-developed and well-nourished.  HENT:  Head: Normocephalic and atraumatic.  Cardiovascular: Normal rate, regular rhythm and normal heart sounds.   Pulmonary/Chest: Effort normal and breath sounds normal. She has no wheezes.  Neurological: She is alert and oriented to person, place, and time.  Psychiatric: She has a normal mood and affect. Her behavior is normal.          Assessment & Plan:  ADHD- increased adderall XR to . Gave one month to try. Will send mychart msg to let me know how she is doing. May consider adding afternoon immediate release if needed. Vitals look great. Pt declines any depression or anxiety that is ongoing.

## 2015-06-20 ENCOUNTER — Telehealth: Payer: Self-pay | Admitting: *Deleted

## 2015-06-20 NOTE — Telephone Encounter (Signed)
Pt left vm today stating that she can't remember how to use the Mychart portal so she just called instead.  She said that she's still liking the adderall & is due for a refill.  Please advise if this is appropriate.

## 2015-06-21 ENCOUNTER — Other Ambulatory Visit: Payer: Self-pay | Admitting: *Deleted

## 2015-06-21 MED ORDER — AMPHETAMINE-DEXTROAMPHET ER 30 MG PO CP24: 30.0000 mg | ORAL_CAPSULE | ORAL | Status: DC

## 2015-06-21 NOTE — Telephone Encounter (Signed)
Yes give 2 post dated rx. Needs to follow up in 2 months.

## 2015-09-03 ENCOUNTER — Encounter: Payer: Self-pay | Admitting: Physician Assistant

## 2015-09-03 ENCOUNTER — Ambulatory Visit (INDEPENDENT_AMBULATORY_CARE_PROVIDER_SITE_OTHER): Payer: BLUE CROSS/BLUE SHIELD | Admitting: Physician Assistant

## 2015-09-03 VITALS — BP 133/69 | HR 84 | Ht 69.0 in | Wt 176.2 lb

## 2015-09-03 DIAGNOSIS — F900 Attention-deficit hyperactivity disorder, predominantly inattentive type: Secondary | ICD-10-CM | POA: Diagnosis not present

## 2015-09-03 DIAGNOSIS — F329 Major depressive disorder, single episode, unspecified: Secondary | ICD-10-CM

## 2015-09-03 DIAGNOSIS — F32A Depression, unspecified: Secondary | ICD-10-CM | POA: Insufficient documentation

## 2015-09-03 MED ORDER — BUPROPION HCL ER (XL) 150 MG PO TB24: 150.0000 mg | ORAL_TABLET | Freq: Every day | ORAL | Status: DC

## 2015-09-03 MED ORDER — BUPROPION HCL ER (XL) 150 MG PO TB24
150.0000 mg | ORAL_TABLET | Freq: Every day | ORAL | Status: DC
Start: 2015-09-03 — End: 2015-10-08

## 2015-09-03 NOTE — Patient Instructions (Signed)
Will call with strattera samples.   Atomoxetine capsules What is this medicine? ATOMOXETINE (AT oh mox e teen) is used to treat attention deficit/hyperactivity disorder, also known as ADHD. It is not a stimulant like other drugs for ADHD. This drug can improve attention span, concentration, and emotional control. It can also reduce restless or overactive behavior. This medicine may be used for other purposes; ask your health care provider or pharmacist if you have questions. What should I tell my health care provider before I take this medicine? They need to know if you have any of these conditions: -glaucoma -high or low blood pressure -history of stroke -irregular heartbeat or other cardiac disease -liver disease -mania or bipolar disorder -pheochromocytoma -suicidal thoughts -an unusual or allergic reaction to atomoxetine, other medicines, foods, dyes, or preservatives -pregnant or trying to get pregnant -breast-feeding How should I use this medicine? Take this medicine by mouth with a glass of water. Follow the directions on the prescription label. You can take it with or without food. If it upsets your stomach, take it with food. If you have difficulty sleeping and you take more than 1 dose per day, take your last dose before 6 PM. Take your medicine at regular intervals. Do not take it more often than directed. Do not stop taking except on your doctor's advice. A special MedGuide will be given to you by the pharmacist with each prescription and refill. Be sure to read this information carefully each time. Talk to your pediatrician regarding the use of this medicine in children. While this drug may be prescribed for children as young as 6 years for selected conditions, precautions do apply. Overdosage: If you think you have taken too much of this medicine contact a poison control center or emergency room at once. NOTE: This medicine is only for you. Do not share this medicine with  others. What if I miss a dose? If you miss a dose, take it as soon as you can. If it is almost time for your next dose, take only that dose. Do not take double or extra doses. What may interact with this medicine? Do not take this medicine with any of the following medications: -cisapride -dofetilide -dronedarone -MAOIs like Carbex, Eldepryl, Marplan, Nardil, and Parnate -pimozide -reboxetine -thioridazine -ziprasidone This medicine may also interact with the following medications: -certain medicines for blood pressure, heart disease, irregular heart beat -certain medicines for depression, anxiety, or psychotic disturbances -certain medicines for lung disease like albuterol -cold or allergy medicines -fluoxetine -medicines that increase blood pressure like dopamine, dobutamine, or ephedrine -other medicines that prolong the QT interval (cause an abnormal heart rhythm) -paroxetine -quinidine -stimulant medicines for attention disorders, weight loss, or to stay awake This list may not describe all possible interactions. Give your health care provider a list of all the medicines, herbs, non-prescription drugs, or dietary supplements you use. Also tell them if you smoke, drink alcohol, or use illegal drugs. Some items may interact with your medicine. What should I watch for while using this medicine? It may take a week or more for this medicine to take effect. This is why it is very important to continue taking the medicine and not miss any doses. If you have been taking this medicine regularly for some time, do not suddenly stop taking it. Ask your doctor or health care professional for advice. Rarely, this medicine may increase thoughts of suicide or suicide attempts in children and teenagers. Call your child's health care professional right away  if your child or teenager has new or increased thoughts of suicide or has changes in mood or behavior like becoming irritable or anxious. Regularly  monitor your child for these behavioral changes. For males, contact you doctor or health care professional right away if you have an erection that lasts longer than 4 hours or if it becomes painful. This may be a sign of serious problem and must be treated right away to prevent permanent damage. You may get drowsy or dizzy. Do not drive, use machinery, or do anything that needs mental alertness until you know how this medicine affects you. Do not stand or sit up quickly, especially if you are an older patient. This reduces the risk of dizzy or fainting spells. Alcohol can make you more drowsy and dizzy. Avoid alcoholic drinks. Do not treat yourself for coughs, colds or allergies without asking your doctor or health care professional for advice. Some ingredients can increase possible side effects. Your mouth may get dry. Chewing sugarless gum or sucking hard candy, and drinking plenty of water will help. What side effects may I notice from receiving this medicine? Side effects that you should report to your doctor or health care professional as soon as possible: -allergic reactions like skin rash, itching or hives, swelling of the face, lips, or tongue -breathing problems -chest pain -dark urine -fast, irregular heartbeat -general ill feeling or flu-like symptoms -high blood pressure -males: prolonged or painful erection -stomach pain or tenderness -trouble passing urine or change in the amount of urine -vomiting -weight loss -yellowing of the eyes or skin Side effects that usually do not require medical attention (report to your doctor or health care professional if they continue or are bothersome): -change in sex drive or performance -constipation or diarrhea -headache -loss of appetite -menstrual period irregularities -nausea -stomach upset This list may not describe all possible side effects. Call your doctor for medical advice about side effects. You may report side effects to FDA at  1-800-FDA-1088. Where should I keep my medicine? Keep out of the reach of children. Store at room temperature between 15 and 30 degrees C (59 and 86 degrees F). Throw away any unused medication after the expiration date. NOTE: This sheet is a summary. It may not cover all possible information. If you have questions about this medicine, talk to your doctor, pharmacist, or health care provider.    2016, Elsevier/Gold Standard. (2014-01-26 16:10:9615:29:22)

## 2015-09-03 NOTE — Progress Notes (Signed)
   Subjective:    Patient ID: Haley Church, female    DOB: 1976-03-22, 39 y.o.   MRN: [id number]  HPI Patient is a 39 year old female who presents to the clinic to discuss ADHD. She previously been on Adderall and doing well with focus but she felt like it was causing her to feel more depressed. She has a history of depression throughout her life but she is able to manage with diet and exercise. When she was on the Adderall she noticed herself feeling more and more down. She decided to stop Adderall she noticed that she felt a lot better and a lot happier. She is now concerned because she seems to can't get anything accomplished. She's having trouble finishing past. She would like to discuss something for her focus that will not cause increased anxiety or depression. She denies any suicidal or homicidal thoughts.   Review of Systems  All other systems reviewed and are negative.      Objective:   Physical Exam  Constitutional: She is oriented to person, place, and time. She appears well-developed and well-nourished.  HENT:  Head: Normocephalic and atraumatic.  Cardiovascular: Normal rate, regular rhythm and normal heart sounds.   Pulmonary/Chest: Effort normal and breath sounds normal.  Neurological: She is alert and oriented to person, place, and time.  Psychiatric: She has a normal mood and affect. Her behavior is normal.          Assessment & Plan:  ADHD/depression-Adderall was placed on intolerance list. PH Q9 was 7 today. GAD-7 was 7 today. Discussed options such as Strattera or Wellbutrin. We do not have the Strattera starter pack in office. I will call the drug rep and see if we can get this for her. In the meantime we are want to try Wellbutrin. I discussed with her this can help with focus, anxiety and depression. Started at Wellbutrin 150 mg daily. Discussed it can take some time to get in her systems the chest 4-6 weeks. We can follow-up in 2 months. She can take Strattera and  Wellbutrin together however she is doing well on Wellbutrin I suggest not starting Strattera. Side effects of both medications were discussed. Follow up with any worsening symptoms.

## 2015-09-09 ENCOUNTER — Ambulatory Visit: Payer: BLUE CROSS/BLUE SHIELD | Admitting: Physician Assistant

## 2015-09-20 ENCOUNTER — Other Ambulatory Visit: Payer: Self-pay | Admitting: *Deleted

## 2015-09-20 MED ORDER — CLONAZEPAM 0.5 MG PO TABS: 0.5000 mg | ORAL_TABLET | Freq: Two times a day (BID) | ORAL | Status: DC | PRN

## 2015-10-02 ENCOUNTER — Other Ambulatory Visit: Payer: Self-pay | Admitting: Physician Assistant

## 2015-10-08 ENCOUNTER — Other Ambulatory Visit: Payer: Self-pay | Admitting: *Deleted

## 2015-10-08 MED ORDER — BUPROPION HCL ER (XL) 150 MG PO TB24: 150.0000 mg | ORAL_TABLET | Freq: Every day | ORAL | Status: DC

## 2015-10-25 ENCOUNTER — Other Ambulatory Visit: Payer: Self-pay | Admitting: *Deleted

## 2015-10-25 MED ORDER — ATOMOXETINE HCL 80 MG PO CAPS: 80.0000 mg | ORAL_CAPSULE | Freq: Every day | ORAL | Status: DC

## 2015-11-01 ENCOUNTER — Telehealth: Payer: Self-pay | Admitting: Physician Assistant

## 2015-11-01 NOTE — Telephone Encounter (Signed)
PA for Strattera submitted and approved. Ref #: [id number]. Valid: 10/25/15-09/27/2038. Pharmacy notified.

## 2015-11-12 ENCOUNTER — Ambulatory Visit (INDEPENDENT_AMBULATORY_CARE_PROVIDER_SITE_OTHER): Payer: BLUE CROSS/BLUE SHIELD | Admitting: Physician Assistant

## 2015-11-12 ENCOUNTER — Encounter: Payer: Self-pay | Admitting: Physician Assistant

## 2015-11-12 VITALS — BP 114/76 | HR 102 | Ht 69.0 in | Wt 171.0 lb

## 2015-11-12 DIAGNOSIS — L299 Pruritus, unspecified: Secondary | ICD-10-CM

## 2015-11-12 DIAGNOSIS — L989 Disorder of the skin and subcutaneous tissue, unspecified: Secondary | ICD-10-CM

## 2015-11-12 DIAGNOSIS — T7840XD Allergy, unspecified, subsequent encounter: Secondary | ICD-10-CM | POA: Diagnosis not present

## 2015-11-12 MED ORDER — TRIAMCINOLONE ACETONIDE 0.1 % EX CREA: 1.0000 "application " | TOPICAL_CREAM | Freq: Two times a day (BID) | CUTANEOUS | Status: DC

## 2015-11-12 NOTE — Progress Notes (Signed)
   Subjective:    Patient ID: Haley Church, female    DOB: [birth year], 40 y.o.   MRN: [id number]  HPI Pt is a 40 yo female who presents to the clinic with itchy bumps on chest, neck and face for the last 6 months to a year. She notices new ones every morning and eventually they go away after a few days but new ones come in it's place. No one else in the house has bumps or itching. No SOB, dysphagia, lip swelling. 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 That was last summer. She hasn't tried anything to make bumps better. She is aware of some food insentivites to glutuen and sugars. She would like to be allergy tested.    Review of Systems  All other systems reviewed and are negative.      Objective:   Physical Exam  Constitutional: She is oriented to person, place, and time. She appears well-developed and well-nourished.  HENT:  Head: Normocephalic and atraumatic.  Cardiovascular: Normal rate, regular rhythm and normal heart sounds.   Pulmonary/Chest: Effort normal and breath sounds normal.  Neurological: She is alert and oriented to person, place, and time.  Skin: Skin is dry.  1-66mm red papules along neck line, hairline of neck, in between breast with surrounding erythema from scratching.   Psychiatric: She has a normal mood and affect. Her behavior is normal.          Assessment & Plan:  Itching, bumps on skin- unclear etiology. Discussed food sensitivity vs allergy to dust mites or mold. Will send to allergist. If she wants to try treatment before suggest daily zyrtec/claritin and given some tiamcinolone cream for bumps as needed.    Hx of analphaxis- she does not have epi pen. i offered to refill today but she felt like too expensive and she would not pick up. I do think she needs more testing to clarify this allergy.

## 2015-11-12 NOTE — Patient Instructions (Signed)
Cream as needed for bumps.  Claritin/zyrtec daily for anti-histamine.

## 2015-12-10 ENCOUNTER — Ambulatory Visit (INDEPENDENT_AMBULATORY_CARE_PROVIDER_SITE_OTHER): Payer: BLUE CROSS/BLUE SHIELD | Admitting: Physician Assistant

## 2015-12-10 ENCOUNTER — Encounter: Payer: Self-pay | Admitting: Physician Assistant

## 2015-12-10 VITALS — BP 139/78 | HR 95 | Ht 69.0 in | Wt 177.0 lb

## 2015-12-10 DIAGNOSIS — Z3201 Encounter for pregnancy test, result positive: Secondary | ICD-10-CM

## 2015-12-10 DIAGNOSIS — Z8759 Personal history of other complications of pregnancy, childbirth and the puerperium: Secondary | ICD-10-CM

## 2015-12-10 DIAGNOSIS — N926 Irregular menstruation, unspecified: Secondary | ICD-10-CM

## 2015-12-10 DIAGNOSIS — Z8742 Personal history of other diseases of the female genital tract: Secondary | ICD-10-CM

## 2015-12-10 LAB — HCG, QUANTITATIVE, PREGNANCY: hCG, Beta Chain, Quant, S: 13824 m[IU]/mL — ABNORMAL HIGH

## 2015-12-10 NOTE — Patient Instructions (Signed)
First Trimester of Pregnancy The first trimester of pregnancy is from week 1 until the end of week 12 (months 1 through 3). A week after a sperm fertilizes an egg, the egg will implant on the wall of the uterus. This embryo will begin to develop into a baby. Genes from you and your partner are forming the baby. The female genes determine whether the baby is a boy or a girl. At 6-8 weeks, the eyes and face are formed, and the heartbeat can be seen on ultrasound. At the end of 12 weeks, all the baby's organs are formed.  Now that you are pregnant, you will want to do everything you can to have a healthy baby. Two of the most important things are to get good prenatal care and to follow your health care provider's instructions. Prenatal care is all the medical care you receive before the baby's birth. This care will help prevent, find, and treat any problems during the pregnancy and childbirth. BODY CHANGES Your body goes through many changes during pregnancy. The changes vary from woman to woman.   You may gain or lose a couple of pounds at first.  You may feel sick to your stomach (nauseous) and throw up (vomit). If the vomiting is uncontrollable, call your health care provider.  You may tire easily.  You may develop headaches that can be relieved by medicines approved by your health care provider.  You may urinate more often. Painful urination may mean you have a bladder infection.  You may develop heartburn as a result of your pregnancy.  You may develop constipation because certain hormones are causing the muscles that push waste through your intestines to slow down.  You may develop hemorrhoids or swollen, bulging veins (varicose veins).  Your breasts may begin to grow larger and become tender. Your nipples may stick out more, and the tissue that surrounds them (areola) may become darker.  Your gums may bleed and may be sensitive to brushing and flossing.  Dark spots or blotches (chloasma,  mask of pregnancy) may develop on your face. This will likely fade after the baby is born.  Your menstrual periods will stop.  You may have a loss of appetite.  You may develop cravings for certain kinds of food.  You may have changes in your emotions from day to day, such as being excited to be pregnant or being concerned that something may go wrong with the pregnancy and baby.  You may have more vivid and strange dreams.  You may have changes in your hair. These can include thickening of your hair, rapid growth, and changes in texture. Some women also have hair loss during or after pregnancy, or hair that feels dry or thin. Your hair will most likely return to normal after your baby is born. WHAT TO EXPECT AT YOUR PRENATAL VISITS During a routine prenatal visit:  You will be weighed to make sure you and the baby are growing normally.  Your blood pressure will be taken.  Your abdomen will be measured to track your baby's growth.  The fetal heartbeat will be listened to starting around week 10 or 12 of your pregnancy.  Test results from any previous visits will be discussed. Your health care provider may ask you:  How you are feeling.  If you are feeling the baby move.  If you have had any abnormal symptoms, such as leaking fluid, bleeding, severe headaches, or abdominal cramping.  If you are using any tobacco products,   including cigarettes, chewing tobacco, and electronic cigarettes.  If you have any questions. Other tests that may be performed during your first trimester include:  Blood tests to find your blood type and to check for the presence of any previous infections. They will also be used to check for low iron levels (anemia) and Rh antibodies. Later in the pregnancy, blood tests for diabetes will be done along with other tests if problems develop.  Urine tests to check for infections, diabetes, or protein in the urine.  An ultrasound to confirm the proper growth  and development of the baby.  An amniocentesis to check for possible genetic problems.  Fetal screens for spina bifida and Down syndrome.  You may need other tests to make sure you and the baby are doing well.  HIV (human immunodeficiency virus) testing. Routine prenatal testing includes screening for HIV, unless you choose not to have this test. HOME CARE INSTRUCTIONS  Medicines  Follow your health care provider's instructions regarding medicine use. Specific medicines may be either safe or unsafe to take during pregnancy.  Take your prenatal vitamins as directed.  If you develop constipation, try taking a stool softener if your health care provider approves. Diet  Eat regular, well-balanced meals. Choose a variety of foods, such as meat or vegetable-based protein, fish, milk and low-fat dairy products, vegetables, fruits, and whole grain breads and cereals. Your health care provider will help you determine the amount of weight gain that is right for you.  Avoid raw meat and uncooked cheese. These carry germs that can cause birth defects in the baby.  Eating four or five small meals rather than three large meals a day may help relieve nausea and vomiting. If you start to feel nauseous, eating a few soda crackers can be helpful. Drinking liquids between meals instead of during meals also seems to help nausea and vomiting.  If you develop constipation, eat more high-fiber foods, such as fresh vegetables or fruit and whole grains. Drink enough fluids to keep your urine clear or pale yellow. Activity and Exercise  Exercise only as directed by your health care provider. Exercising will help you:  Control your weight.  Stay in shape.  Be prepared for labor and delivery.  Experiencing pain or cramping in the lower abdomen or low back is a good sign that you should stop exercising. Check with your health care provider before continuing normal exercises.  Try to avoid standing for long  periods of time. Move your legs often if you must stand in one place for a long time.  Avoid heavy lifting.  Wear low-heeled shoes, and practice good posture.  You may continue to have sex unless your health care provider directs you otherwise. Relief of Pain or Discomfort  Wear a good support bra for breast tenderness.   Take warm sitz baths to soothe any pain or discomfort caused by hemorrhoids. Use hemorrhoid cream if your health care provider approves.   Rest with your legs elevated if you have leg cramps or low back pain.  If you develop varicose veins in your legs, wear support hose. Elevate your feet for 15 minutes, 3-4 times a day. Limit salt in your diet. Prenatal Care  Schedule your prenatal visits by the twelfth week of pregnancy. They are usually scheduled monthly at first, then more often in the last 2 months before delivery.  Write down your questions. Take them to your prenatal visits.  Keep all your prenatal visits as directed by your   health care provider. Safety  Wear your seat belt at all times when driving.  Make a list of emergency phone numbers, including numbers for family, friends, the hospital, and police and fire departments. General Tips  Ask your health care provider for a referral to a local prenatal education class. Begin classes no later than at the beginning of month 6 of your pregnancy.  Ask for help if you have counseling or nutritional needs during pregnancy. Your health care provider can offer advice or refer you to specialists for help with various needs.  Do not use hot tubs, steam rooms, or saunas.  Do not douche or use tampons or scented sanitary pads.  Do not cross your legs for long periods of time.  Avoid cat litter boxes and soil used by cats. These carry germs that can cause birth defects in the baby and possibly loss of the fetus by miscarriage or stillbirth.  Avoid all smoking, herbs, alcohol, and medicines not prescribed by  your health care provider. Chemicals in these affect the formation and growth of the baby.  Do not use any tobacco products, including cigarettes, chewing tobacco, and electronic cigarettes. If you need help quitting, ask your health care provider. You may receive counseling support and other resources to help you quit.  Schedule a dentist appointment. At home, brush your teeth with a soft toothbrush and be gentle when you floss. SEEK MEDICAL CARE IF:   You have dizziness.  You have mild pelvic cramps, pelvic pressure, or nagging pain in the abdominal area.  You have persistent nausea, vomiting, or diarrhea.  You have a bad smelling vaginal discharge.  You have pain with urination.  You notice increased swelling in your face, hands, legs, or ankles. SEEK IMMEDIATE MEDICAL CARE IF:   You have a fever.  You are leaking fluid from your vagina.  You have spotting or bleeding from your vagina.  You have severe abdominal cramping or pain.  You have rapid weight gain or loss.  You vomit blood or material that looks like coffee grounds.  You are exposed to German measles and have never had them.  You are exposed to fifth disease or chickenpox.  You develop a severe headache.  You have shortness of breath.  You have any kind of trauma, such as from a fall or a car accident.   This information is not intended to replace advice given to you by your health care provider. Make sure you discuss any questions you have with your health care provider.   Document Released: 09/08/2001 Document Revised: 10/05/2014 Document Reviewed: 07/25/2013 Elsevier Interactive Patient Education 2016 [employer]  

## 2015-12-10 NOTE — Progress Notes (Signed)
   Subjective:    Patient ID: Haley MeuseAmber Church, female    DOB: [birth year], 40 y.o.   MRN: [id number]  HPI  Pt is a 40 yo female who presents to the clinic with missed period and positive home pregnancy test. She has a hx of miscarriages. She has had some light cramping and light spotting for last 2 days. LMP was 11/04/15. She denies any nausea or vomiting. She stopped all medications after positive home pregnancy test.  .    Review of Systems See HPI.     Objective:   Physical Exam  Constitutional: She is oriented to person, place, and time. She appears well-developed and well-nourished.  Neurological: She is alert and oriented to person, place, and time.  Psychiatric: She has a normal mood and affect. Her behavior is normal.          Assessment & Plan:  Missed period/hx of miscarriage- reports positive home pregnancy. Based on LMP should be around [redacted] weeks pregnant. Will confirm with stat HCG quantification. Will alert patient and get an appt with GYN ASAP due to hx of miscarriage. Discussed signs of miscarriage and red flags of emergency. Pt has stopped all medications. She has GYN but will not see her until confirmed pregnancy.

## 2015-12-25 ENCOUNTER — Other Ambulatory Visit: Payer: Self-pay | Admitting: Physician Assistant

## 2015-12-25 NOTE — Telephone Encounter (Signed)
Ok to fill 

## 2016-01-03 ENCOUNTER — Other Ambulatory Visit: Payer: Self-pay | Admitting: Obstetrics and Gynecology

## 2016-01-03 ENCOUNTER — Encounter (HOSPITAL_COMMUNITY): Payer: Self-pay | Admitting: *Deleted

## 2016-01-04 MED ORDER — DOXYCYCLINE HYCLATE 100 MG IV SOLR
100.0000 mg | Freq: Once | INTRAVENOUS | Status: AC
  Administered 2016-01-05 (×2): 100 mg via INTRAVENOUS
  Filled 2016-01-04: qty 100

## 2016-01-05 ENCOUNTER — Ambulatory Visit (HOSPITAL_COMMUNITY)
Admission: AD | Admit: 2016-01-05 | Discharge: 2016-01-05 | Disposition: A | Discharge status: Home / Self Care | Payer: BLUE CROSS/BLUE SHIELD | Source: Ambulatory Visit | Attending: Obstetrics and Gynecology | Admitting: Obstetrics and Gynecology

## 2016-01-05 ENCOUNTER — Encounter (HOSPITAL_COMMUNITY)
Admission: AD | Disposition: A | Discharge status: Home / Self Care | Payer: Self-pay | Source: Ambulatory Visit | Attending: Obstetrics and Gynecology

## 2016-01-05 ENCOUNTER — Inpatient Hospital Stay (HOSPITAL_COMMUNITY): Payer: BLUE CROSS/BLUE SHIELD | Admitting: Anesthesiology

## 2016-01-05 ENCOUNTER — Encounter (HOSPITAL_COMMUNITY): Payer: Self-pay | Admitting: *Deleted

## 2016-01-05 DIAGNOSIS — O021 Missed abortion: Secondary | ICD-10-CM | POA: Diagnosis present

## 2016-01-05 DIAGNOSIS — O2621 Pregnancy care for patient with recurrent pregnancy loss, first trimester: Secondary | ICD-10-CM

## 2016-01-05 DIAGNOSIS — Z3A08 8 weeks gestation of pregnancy: Secondary | ICD-10-CM | POA: Insufficient documentation

## 2016-01-05 HISTORY — DX: Anxiety disorder, unspecified: F41.9

## 2016-01-05 HISTORY — DX: Depression, unspecified: F32.A

## 2016-01-05 HISTORY — DX: Major depressive disorder, single episode, unspecified: F32.9

## 2016-01-05 HISTORY — PX: DILATION AND EVACUATION: SHX1459

## 2016-01-05 LAB — CBC
HCT: 37.6 % (ref 36.0–46.0)
Hemoglobin: 12.9 g/dL (ref 12.0–15.0)
MCH: 30.9 pg (ref 26.0–34.0)
MCHC: 34.3 g/dL (ref 30.0–36.0)
MCV: 90.2 fL (ref 78.0–100.0)
PLATELETS: 210 10*3/uL (ref 150–400)
RBC: 4.17 MIL/uL (ref 3.87–5.11)
RDW: 13.4 % (ref 11.5–15.5)
WBC: 5.2 10*3/uL (ref 4.0–10.5)

## 2016-01-05 IMAGING — CR DG FOOT COMPLETE 3+V*R*
3 series · 3 of 3 positions shown · non-contrast
Comparison: None.

CLINICAL DATA: Injury to right heel with pain.  Initial encounter.

EXAM:
RIGHT FOOT COMPLETE - 3+ VIEW

[foot ap]
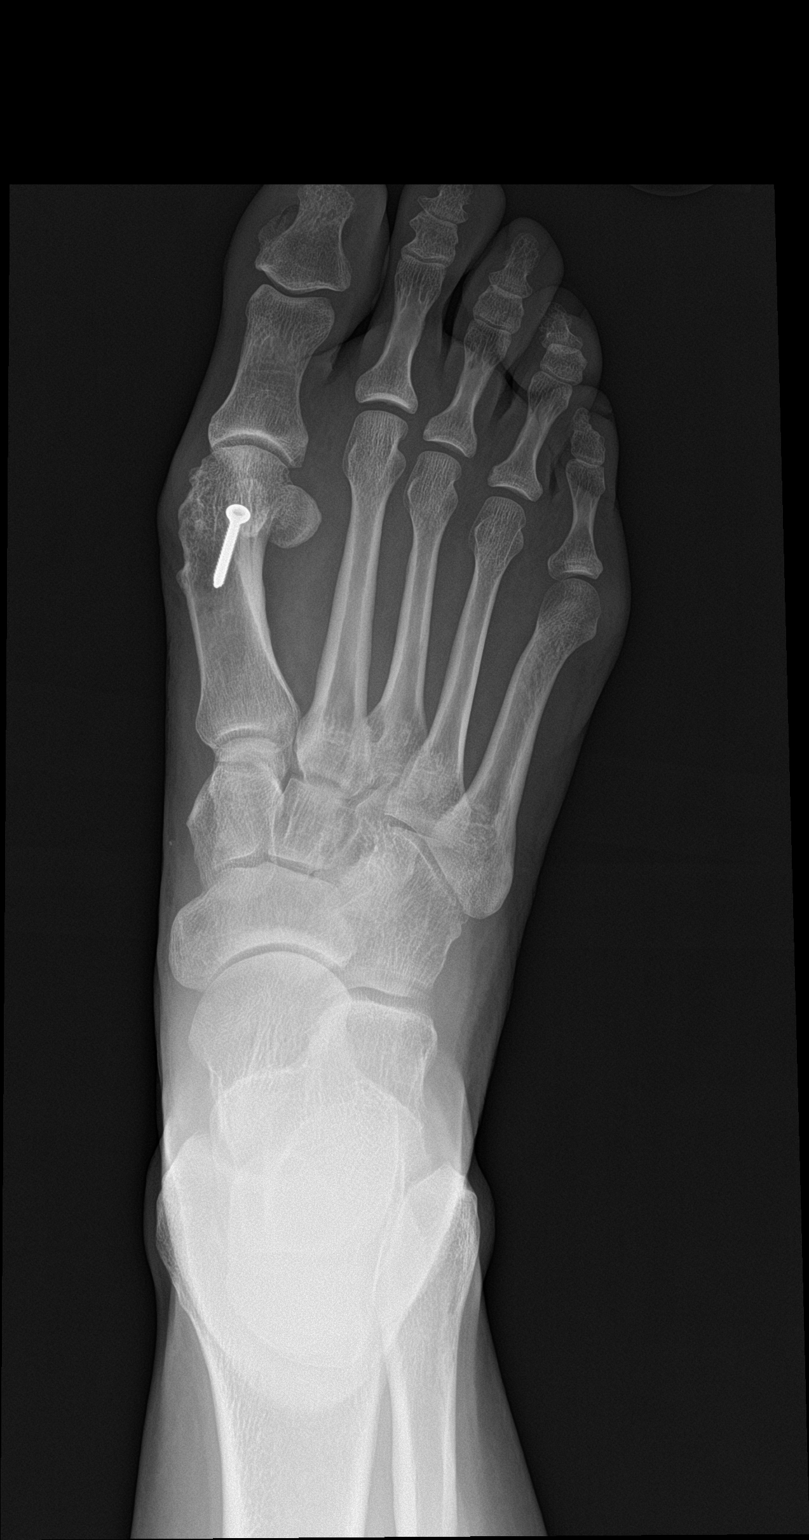

[foot obl]
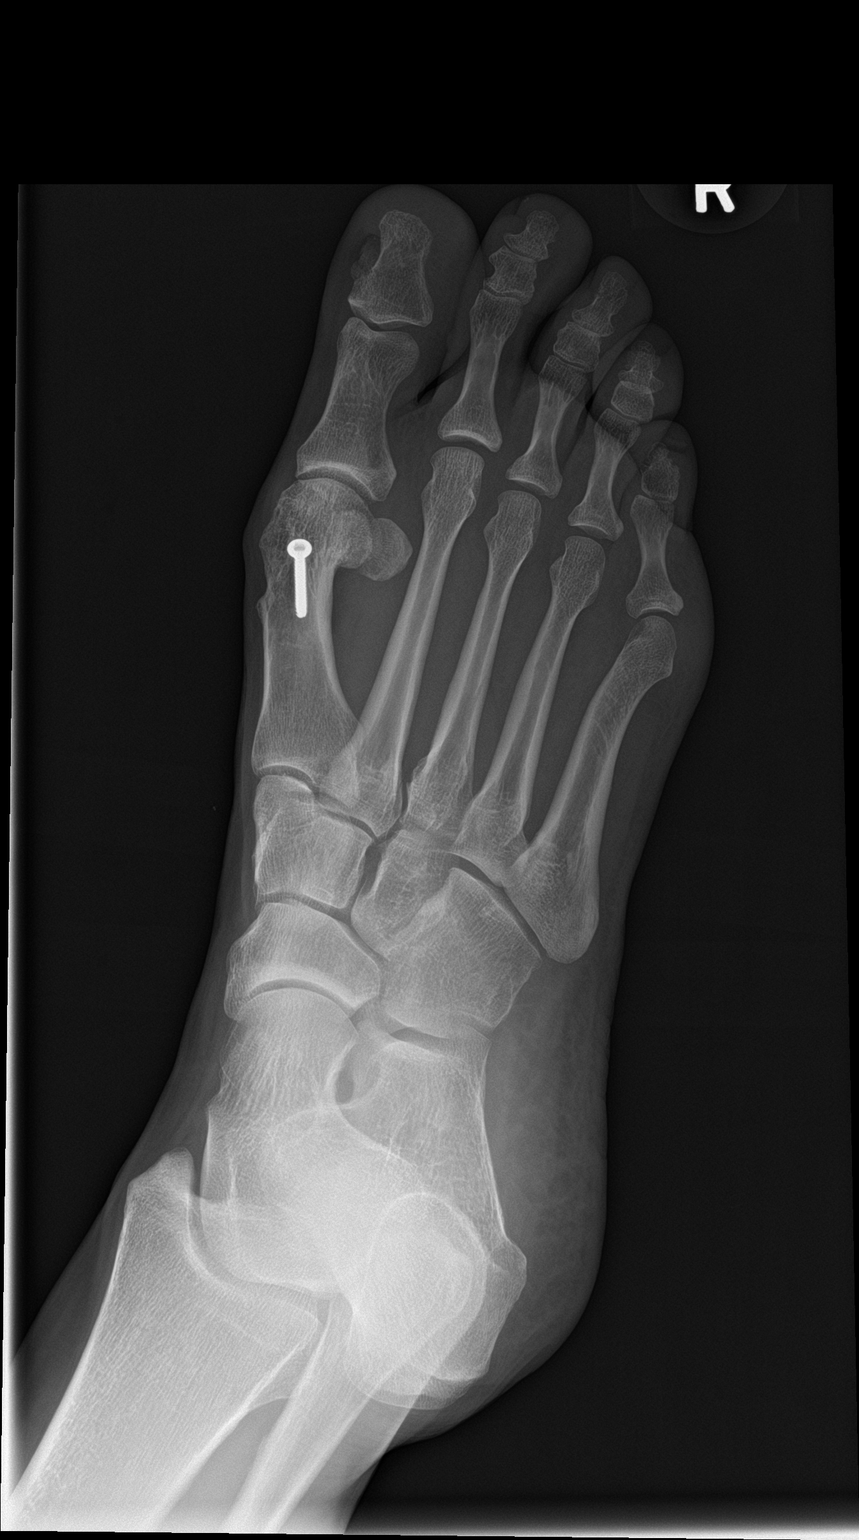

[foot lat]
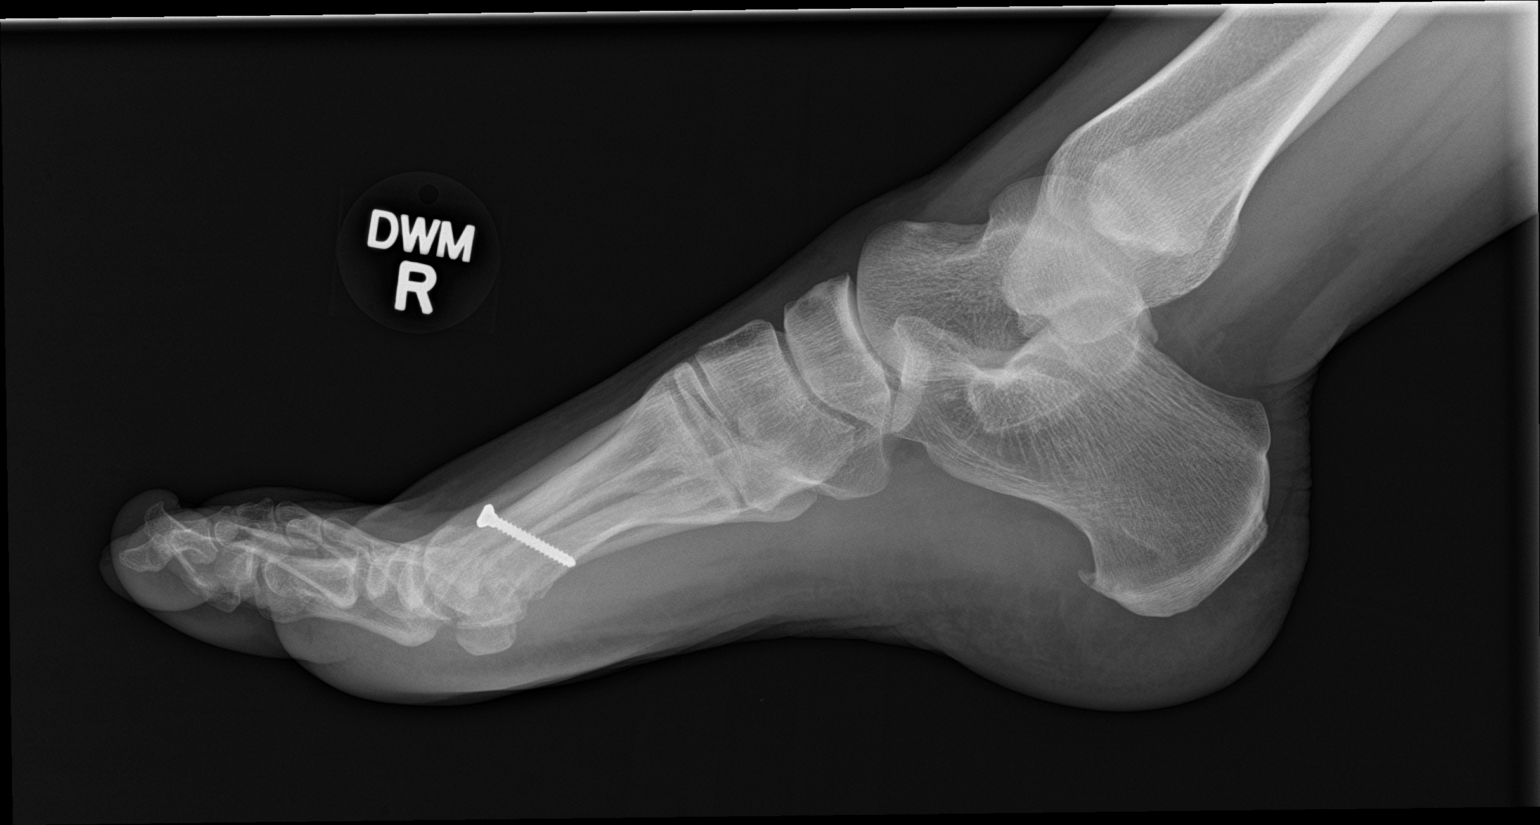

[3 of 3 positions shown; findings below may reference images not displayed]

FINDINGS: No fracture or dislocation is identified. Changes are seen related
to prior right first metatarsal osteotomy. No significant
degenerative changes or bony lesions. Soft tissues are unremarkable.
IMPRESSION: No acute fracture.

## 2016-01-05 SURGERY — DILATION AND EVACUATION, UTERUS
Anesthesia: Monitor Anesthesia Care

## 2016-01-05 MED ORDER — DIPHENHYDRAMINE HCL 50 MG/ML IJ SOLN
12.5000 mg | Freq: Once | INTRAMUSCULAR | Status: AC
  Administered 2016-01-05: 12.5 mg via INTRAVENOUS

## 2016-01-05 MED ORDER — DEXAMETHASONE SODIUM PHOSPHATE 4 MG/ML IJ SOLN
INTRAMUSCULAR | Status: AC
  Filled 2016-01-05: qty 1

## 2016-01-05 MED ORDER — MIDAZOLAM HCL 2 MG/2ML IJ SOLN
INTRAMUSCULAR | Status: AC
  Filled 2016-01-05: qty 2

## 2016-01-05 MED ORDER — HYDROMORPHONE HCL 1 MG/ML IJ SOLN: 0.2500 mg | INTRAMUSCULAR | Status: DC | PRN

## 2016-01-05 MED ORDER — MEPERIDINE HCL 25 MG/ML IJ SOLN: 6.2500 mg | INTRAMUSCULAR | Status: DC | PRN

## 2016-01-05 MED ORDER — PROPOFOL 10 MG/ML IV BOLUS
INTRAVENOUS | Status: DC | PRN
  Administered 2016-01-05 (×4): 20 mg via INTRAVENOUS

## 2016-01-05 MED ORDER — DEXAMETHASONE SODIUM PHOSPHATE 10 MG/ML IJ SOLN
INTRAMUSCULAR | Status: DC | PRN
  Administered 2016-01-05: 4 mg via INTRAVENOUS

## 2016-01-05 MED ORDER — ONDANSETRON HCL 4 MG/2ML IJ SOLN
INTRAMUSCULAR | Status: AC
  Filled 2016-01-05: qty 2

## 2016-01-05 MED ORDER — CITRIC ACID-SODIUM CITRATE 334-500 MG/5ML PO SOLN
30.0000 mL | Freq: Once | ORAL | Status: AC
Start: 2016-01-05 — End: 2016-01-05
  Administered 2016-01-05: 30 mL via ORAL
  Filled 2016-01-05: qty 15

## 2016-01-05 MED ORDER — KETOROLAC TROMETHAMINE 30 MG/ML IJ SOLN
INTRAMUSCULAR | Status: AC
  Filled 2016-01-05: qty 2

## 2016-01-05 MED ORDER — CHLOROPROCAINE HCL 1 % IJ SOLN
INTRAMUSCULAR | Status: AC
  Filled 2016-01-05: qty 30

## 2016-01-05 MED ORDER — LIDOCAINE HCL (CARDIAC) 20 MG/ML IV SOLN
INTRAVENOUS | Status: DC | PRN
  Administered 2016-01-05: 80 mg via INTRAVENOUS

## 2016-01-05 MED ORDER — DIPHENHYDRAMINE HCL 50 MG/ML IJ SOLN
INTRAMUSCULAR | Status: AC
  Administered 2016-01-05: 12.5 mg via INTRAVENOUS
  Filled 2016-01-05: qty 1

## 2016-01-05 MED ORDER — LACTATED RINGERS IV SOLN
INTRAVENOUS | Status: DC | PRN
  Administered 2016-01-05: 11:00:00 via INTRAVENOUS

## 2016-01-05 MED ORDER — MIDAZOLAM HCL 2 MG/2ML IJ SOLN
INTRAMUSCULAR | Status: DC | PRN
  Administered 2016-01-05: 2 mg via INTRAVENOUS

## 2016-01-05 MED ORDER — KETOROLAC TROMETHAMINE 30 MG/ML IJ SOLN
INTRAMUSCULAR | Status: DC | PRN
  Administered 2016-01-05: 30 mg via INTRAVENOUS
  Administered 2016-01-05: 30 mg via INTRAMUSCULAR

## 2016-01-05 MED ORDER — FENTANYL CITRATE (PF) 100 MCG/2ML IJ SOLN
INTRAMUSCULAR | Status: DC | PRN
  Administered 2016-01-05: 100 ug via INTRAVENOUS

## 2016-01-05 MED ORDER — LACTATED RINGERS IV SOLN
INTRAVENOUS | Status: DC
  Administered 2016-01-05 (×2): via INTRAVENOUS

## 2016-01-05 MED ORDER — PROPOFOL 10 MG/ML IV BOLUS
INTRAVENOUS | Status: AC
  Filled 2016-01-05: qty 20

## 2016-01-05 MED ORDER — ONDANSETRON HCL 4 MG/2ML IJ SOLN
INTRAMUSCULAR | Status: DC | PRN
  Administered 2016-01-05: 4 mg via INTRAVENOUS

## 2016-01-05 MED ORDER — IBUPROFEN 800 MG PO TABS: 800.0000 mg | ORAL_TABLET | Freq: Three times a day (TID) | ORAL | Status: DC | PRN

## 2016-01-05 MED ORDER — LIDOCAINE HCL (CARDIAC) 20 MG/ML IV SOLN
INTRAVENOUS | Status: AC
  Filled 2016-01-05: qty 5

## 2016-01-05 MED ORDER — CHLOROPROCAINE HCL 1 % IJ SOLN
INTRAMUSCULAR | Status: DC | PRN
  Administered 2016-01-05: 20 mL

## 2016-01-05 MED ORDER — FENTANYL CITRATE (PF) 100 MCG/2ML IJ SOLN
INTRAMUSCULAR | Status: AC
  Filled 2016-01-05: qty 2

## 2016-01-05 MED ORDER — FAMOTIDINE IN NACL 20-0.9 MG/50ML-% IV SOLN
20.0000 mg | Freq: Once | INTRAVENOUS | Status: AC
  Administered 2016-01-05: 20 mg via INTRAVENOUS
  Filled 2016-01-05: qty 50

## 2016-01-05 SURGICAL SUPPLY — 18 items
CATH ROBINSON RED A/P 16FR (CATHETERS) ×3 IMPLANT
CLOTH BEACON ORANGE TIMEOUT ST (SAFETY) ×3 IMPLANT
DECANTER SPIKE VIAL GLASS SM (MISCELLANEOUS) ×3 IMPLANT
GLOVE BIOGEL PI IND STRL 7.0 (GLOVE) ×3 IMPLANT
GLOVE BIOGEL PI INDICATOR 7.0 (GLOVE) ×6
GLOVE ECLIPSE 6.5 STRL STRAW (GLOVE) ×3 IMPLANT
GOWN STRL REUS W/TWL LRG LVL3 (GOWN DISPOSABLE) ×6 IMPLANT
KIT BERKELEY 1ST TRIMESTER 3/8 (MISCELLANEOUS) ×3 IMPLANT
NS IRRIG 1000ML POUR BTL (IV SOLUTION) ×3 IMPLANT
PACK VAGINAL MINOR WOMEN LF (CUSTOM PROCEDURE TRAY) ×3 IMPLANT
PAD OB MATERNITY 4.3X12.25 (PERSONAL CARE ITEMS) ×3 IMPLANT
PAD PREP 24X48 CUFFED NSTRL (MISCELLANEOUS) ×3 IMPLANT
SET BERKELEY SUCTION TUBING (SUCTIONS) ×3 IMPLANT
TOWEL OR 17X24 6PK STRL BLUE (TOWEL DISPOSABLE) ×6 IMPLANT
VACURETTE 10 RIGID CVD (CANNULA) IMPLANT
VACURETTE 7MM CVD STRL WRAP (CANNULA) ×3 IMPLANT
VACURETTE 8 RIGID CVD (CANNULA) IMPLANT
VACURETTE 9 RIGID CVD (CANNULA) IMPLANT

## 2016-01-05 NOTE — MAU Note (Signed)
Patient here for D&E missed AB 8 weeks.

## 2016-01-05 NOTE — Transfer of Care (Signed)
Immediate Anesthesia Transfer of Care Note  Patient: Haley Church  Procedure(s) Performed: Procedure(s): DILATATION AND EVACUATION (N/A)  Patient Location: PACU  Anesthesia Type:MAC and MAC combined with regional for post-op pain  Level of Consciousness: sedated  Airway & Oxygen Therapy: Patient Spontanous Breathing  Post-op Assessment: Report given to RN  Post vital signs: Reviewed and stable  Last Vitals:  Filed Vitals:   01/05/16 0926  BP: 127/92  Pulse: 96  Temp: 36.8 C  Resp: 18    Complications: No apparent anesthesia complications

## 2016-01-05 NOTE — H&P (Signed)
Haley Church is an 40 y.o. female. [id number] MWF presents for suction D&E due to missed abortion. Pt had vaginal cytotec w/o response and has opted for surgical mgmt. Hx recurrent preg loss. Small  Amount of vaginal bleeding  Pertinent Gynecological History: [id number] D&E x 2 SAB x 3  Menstrual History: Menarche age: n/a Patient's last menstrual period was 11/04/2015.    Past Medical History  Diagnosis Date  . Anxiety   . Depression     Past Surgical History  Procedure Laterality Date  . Laparoscopic abdominal exploration    . Foot surgery    . Ovarian cyst removal    . Dilation and evacuation N/A 04/09/2015    Procedure: DILATATION AND EVACUATION with chromosome analysis;  Surgeon: Maxie BetterSheronette Jonathyn Carothers, MD;  Location: [facility];  Service: Gynecology;  Laterality: N/A;    History reviewed. No pertinent family history.  Social History:  reports that she has never smoked. She does not have any smokeless tobacco history on file. She reports that she does not drink alcohol or use illicit drugs.  Allergies:  Allergies  Allergen Reactions  . Corn-Containing Products Anaphylaxis    Rash/itching face to knees.  . Adderall Xr [Amphetamine-Dextroamphet Er] Other (See Comments)    Causes depression  . Percocet [Oxycodone-Acetaminophen] Itching, Palpitations and Rash    Pt states she can take Acetaminophen  . Sulfa Antibiotics Hives and Rash  . Vicodin [Hydrocodone-Acetaminophen] Palpitations and Rash    No prescriptions prior to admission    Review of Systems  All other systems reviewed and are negative.   Height 5\' 9"  (1.753 m), weight 170 lb (77.111 kg), last menstrual period 11/04/2015. Physical Exam  Constitutional: She is oriented to person, place, and time. She appears well-developed and well-nourished.  HENT:  Head: Atraumatic.  Eyes: EOM are normal.  Neck: Neck supple.  Cardiovascular: Regular rhythm.   Respiratory: Breath sounds normal.  GI: Soft.  Genitourinary:  Vagina normal and uterus normal.  Musculoskeletal: Normal range of motion.  Neurological: She is alert and oriented to person, place, and time.  Skin: Skin is warm and dry.    No results found for this or any previous visit (from the past 24 hour(s)).  No results found.  Assessment/Plan: MAB Recurrent pregnancy loss P) suction D&E. Risk of procedure explained including infection, bleeding, injury to surrounding organ structure, internal scar tissue. All ? answered  Shaquille Murdy A 01/05/2016, 7:56 AM

## 2016-01-05 NOTE — Anesthesia Preprocedure Evaluation (Signed)
Anesthesia Evaluation  Patient identified by MRN, date of birth, ID band Patient awake    Reviewed: Allergy & Precautions, NPO status , Patient's Chart, lab work & pertinent test results  History of Anesthesia Complications Negative for: history of anesthetic complications  Airway Mallampati: II  TM Distance: >3 FB Neck ROM: Full    Dental no notable dental hx. (+) Dental Advisory Given   Pulmonary neg pulmonary ROS,    Pulmonary exam normal breath sounds clear to auscultation       Cardiovascular negative cardio ROS Normal cardiovascular exam Rhythm:Regular Rate:Normal     Neuro/Psych PSYCHIATRIC DISORDERS Anxiety Depression negative neurological ROS     GI/Hepatic negative GI ROS, Neg liver ROS,   Endo/Other  negative endocrine ROS  Renal/GU negative Renal ROS     Musculoskeletal negative musculoskeletal ROS (+)   Abdominal   Peds  Hematology negative hematology ROS (+)   Anesthesia Other Findings   Reproductive/Obstetrics (+) Pregnancy 6 weeks                             Anesthesia Physical  Anesthesia Plan  ASA: II  Anesthesia Plan: MAC   Post-op Pain Management:    Induction: Intravenous  Airway Management Planned:   Additional Equipment:   Intra-op Plan:   Post-operative Plan: Extubation in OR  Informed Consent: I have reviewed the patients History and Physical, chart, labs and discussed the procedure including the risks, benefits and alternatives for the proposed anesthesia with the patient or authorized representative who has indicated his/her understanding and acceptance.   Dental advisory given  Plan Discussed with: CRNA  Anesthesia Plan Comments:         Anesthesia Quick Evaluation

## 2016-01-05 NOTE — Brief Op Note (Signed)
01/05/2016  11:52 AM  PATIENT:  Haley Church  40 y.o. female  PRE-OPERATIVE DIAGNOSIS:  Recurrent Pregnancy Loss, Missed Abortion, Failed Medical Therapy  POST-OPERATIVE DIAGNOSIS:  Recurrent Pregnancy Loss, Missed Abortion, Failed Medical Therapy  PROCEDURE:  Suction dilation and evacuation  SURGEON:  Surgeon(s) and Role:    * Maxie BetterSheronette Keiran Sias, MD - Primary  PHYSICIAN ASSISTANT:   ASSISTANTS: none   ANESTHESIA:   paracervical block and MAC  EBL:  Total I/O In: 700 [I.V.:700] Out: 225 [Urine:200; Blood:25]  BLOOD ADMINISTERED:none  DRAINS: none   LOCAL MEDICATIONS USED:  OTHER nesicaine  SPECIMEN:  Source of Specimen:  POC, chromosomal studies  DISPOSITION OF SPECIMEN:  PATHOLOGY  COUNTS:  YES  TOURNIQUET:  * No tourniquets in log *  DICTATION: .Other Dictation: Dictation Number [id number]  PLAN OF CARE: Discharge to home after PACU  PATIENT DISPOSITION:  PACU - hemodynamically stable.   Delay start of Pharmacological VTE agent (>24hrs) due to surgical blood loss or risk of bleeding: no

## 2016-01-05 NOTE — Discharge Instructions (Signed)
CALL  IF TEMP>100.4, NOTHING PER VAGINA X 2 WK, CALL IF SOAKING A MAXI  PAD EVERY HOUR OR MORE FREQUENTLYDISCHARGE INSTRUCTIONS: D&C / D&E The following instructions have been prepared to help you care for yourself upon your return home.   Personal hygiene:  Use sanitary pads for vaginal drainage, not tampons.  Shower the day after your procedure.  NO tub baths, pools or Jacuzzis for 2-3 weeks.  Wipe front to back after using the bathroom.  Activity and limitations:  Do NOT drive or operate any equipment for 24 hours. The effects of anesthesia are still present and drowsiness may result.  Do NOT rest in bed all day.  Walking is encouraged.  Walk up and down stairs slowly.  You may resume your normal activity in one to two days or as indicated by your physician.  Sexual activity: NO intercourse for at least 2 weeks after the procedure, or as indicated by your physician.  Diet: Eat a light meal as desired this evening. You may resume your usual diet tomorrow.  Return to work: You may resume your work activities in one to two days or as indicated by your doctor.  What to expect after your surgery: Expect to have vaginal bleeding/discharge for 2-3 days and spotting for up to 10 days. It is not unusual to have soreness for up to 1-2 weeks. You may have a slight burning sensation when you urinate for the first day. Mild cramps may continue for a couple of days. You may have a regular period in 2-6 weeks.  Call your doctor for any of the following:  Excessive vaginal bleeding, saturating and changing one pad every hour.  Inability to urinate 6 hours after discharge from hospital.  Pain not relieved by pain medication.  Fever of 100.4 F or greater.  Unusual vaginal discharge or odor.   Call for an appointment:    Patients signature: ______________________  Nurses signature ________________________  Support person's signature_______________________  Can take  ibuprofen/motrin/advil at 5:30PM. Can use heating pad to abdomen. Increase water intake next 48 hours

## 2016-01-06 ENCOUNTER — Encounter (HOSPITAL_COMMUNITY): Payer: Self-pay | Admitting: Obstetrics and Gynecology

## 2016-01-06 NOTE — Anesthesia Postprocedure Evaluation (Signed)
Anesthesia Post Note  Patient: [name]  Procedure(s) Performed: Procedure(s) (LRB): DILATATION AND EVACUATION (N/A)  Patient location during evaluation: PACU Anesthesia Type: MAC Level of consciousness: awake and alert Pain management: pain level controlled Vital Signs Assessment: post-procedure vital signs reviewed and stable Respiratory status: spontaneous breathing Cardiovascular status: stable Anesthetic complications: no    Last Vitals:  Filed Vitals:   01/05/16 1315 01/05/16 1350  BP: 114/78 113/76  Pulse: 73 74  Temp:  36.7 C  Resp: 18 18    Last Pain:  Filed Vitals:   01/05/16 1359  PainSc: 0-No pain                 Lewie LoronJohn Emanual Lamountain

## 2016-01-06 NOTE — Op Note (Signed)
[name]:  [place], [place]                 ACCOUNT NO.:  [id number]  MEDICAL RECORD NO.:  [id number]  LOCATION:  [facility]                          FACILITY:  [facility]  PHYSICIAN:  Maxie BetterSheronette Philipe Laswell, M.D.DATE OF BIRTH:  1976-08-30  DATE OF PROCEDURE:  01/05/2016 DATE OF DISCHARGE:  01/05/2016                              OPERATIVE REPORT   PREOPERATIVE DIAGNOSES:  Missed abortion, recurrent pregnancy loss.  PROCEDURE:  Suction dilation and evacuation.  POSTOPERATIVE DIAGNOSES:  Missed abortion, recurrent pregnancy loss.  ANESTHESIA:  MAC, paracervical block.  SURGEON:  Maxie BetterSheronette [name], MD.  ASSISTANT:  None.  DESCRIPTION OF PROCEDURE:  Under adequate monitored anesthesia, the patient was placed in dorsal lithotomy position.  She was sterilely prepped and draped in usual fashion.  Bladder was catheterized for moderate amount of urine.  Examination under anesthesia revealed an anteverted uterus.  No adnexal masses could be appreciated.  A bivalve speculum placed in the vagina.  A 20 mL of 1% Nesacaine was injected paracervically at the 3 and 9 o'clock positions.  Small amount of blood was noted in the vault and anterior lip of the cervix grasped with a single-tooth tenaculum.  The cervix easily accepted a #23 Pratt dilator and #7 mm curved suction cannula was introduced into the cavity, suctioned for moderate amount of tissue and gently curetted and suctioned.  When all tissue was felt to be removed, all instruments were then removed from the vagina.  SPECIMEN:  Products of conception, were sent to pathology.  A portion was sent for chromosomal analysis.  COUNTS:  Sponge and instrument counts x2 were correct.  COMPLICATION:  None.  The patient tolerated the procedure well and was transferred to recovery in stable condition.     Maxie BetterSheronette Adin Laker, M.D.    Star/MEDQ  D:  01/05/2016  T:  01/05/2016  Job:  [id number]

## 2016-01-29 LAB — CHROMOSOME STD, POC(TISSUE)-NCBH

## 2016-02-25 ENCOUNTER — Ambulatory Visit (INDEPENDENT_AMBULATORY_CARE_PROVIDER_SITE_OTHER): Payer: BLUE CROSS/BLUE SHIELD | Admitting: Physician Assistant

## 2016-02-25 ENCOUNTER — Encounter: Payer: Self-pay | Admitting: Physician Assistant

## 2016-02-25 VITALS — BP 124/73 | HR 91 | Ht 69.0 in | Wt 175.0 lb

## 2016-02-25 DIAGNOSIS — F900 Attention-deficit hyperactivity disorder, predominantly inattentive type: Secondary | ICD-10-CM

## 2016-02-25 DIAGNOSIS — R5383 Other fatigue: Secondary | ICD-10-CM

## 2016-02-25 DIAGNOSIS — R4589 Other symptoms and signs involving emotional state: Secondary | ICD-10-CM

## 2016-02-25 DIAGNOSIS — F489 Nonpsychotic mental disorder, unspecified: Secondary | ICD-10-CM

## 2016-02-25 DIAGNOSIS — N926 Irregular menstruation, unspecified: Secondary | ICD-10-CM

## 2016-02-25 LAB — T3, FREE: T3, Free: 2.7 pg/mL (ref 2.3–4.2)

## 2016-02-25 LAB — FOLLICLE STIMULATING HORMONE: FSH: 5 m[IU]/mL

## 2016-02-25 LAB — CBC WITH DIFFERENTIAL/PLATELET
BASOS PCT: 0 %
Basophils Absolute: 0 cells/uL (ref 0–200)
Eosinophils Absolute: 48 cells/uL (ref 15–500)
Eosinophils Relative: 1 %
HCT: 39.7 % (ref 35.0–45.0)
Hemoglobin: 13.2 g/dL (ref 11.7–15.5)
LYMPHS PCT: 42 %
Lymphs Abs: 2016 cells/uL (ref 850–3900)
MCH: 30.3 pg (ref 27.0–33.0)
MCHC: 33.2 g/dL (ref 32.0–36.0)
MCV: 91.3 fL (ref 80.0–100.0)
MONOS PCT: 7 %
MPV: 10.7 fL (ref 7.5–12.5)
Monocytes Absolute: 336 cells/uL (ref 200–950)
NEUTROS ABS: 2400 {cells}/uL (ref 1500–7800)
Neutrophils Relative %: 50 %
PLATELETS: 253 10*3/uL (ref 140–400)
RBC: 4.35 MIL/uL (ref 3.80–5.10)
RDW: 13.1 % (ref 11.0–15.0)
WBC: 4.8 10*3/uL (ref 3.8–10.8)

## 2016-02-25 LAB — PROGESTERONE: Progesterone: 6 ng/mL

## 2016-02-25 LAB — LUTEINIZING HORMONE: LH: 4.3 m[IU]/mL

## 2016-02-25 LAB — VITAMIN B12: Vitamin B-12: 1329 pg/mL — ABNORMAL HIGH (ref 200–1100)

## 2016-02-25 LAB — TSH: TSH: 1.59 m[IU]/L

## 2016-02-25 LAB — FERRITIN: FERRITIN: 47 ng/mL (ref 10–232)

## 2016-02-25 LAB — T4, FREE: Free T4: 1.1 ng/dL (ref 0.8–1.8)

## 2016-02-25 MED ORDER — AMPHETAMINE-DEXTROAMPHET ER 30 MG PO CP24: 30.0000 mg | ORAL_CAPSULE | ORAL | Status: DC

## 2016-02-25 NOTE — Progress Notes (Signed)
   Subjective:    Patient ID: Haley Church, female    DOB: [birth year], 40 y.o.   MRN: [id number]  HPI  Patient is a 40 year old female who presents to the clinic to follow-up on ADHD. She has been off medication since she found out she was pregnant in early February. She had a D&C after miscarriage on 01/05/2016. She comes in today because she has not had a period since her D&C. She feels moody, unfocused, struggling with weight, and very tired. She denies any feelings of hopelessness and helplessness. She would like to restart her ADHD medications. She is working out regularly doing insanity video series. She is doing her no replacement with beach body. She seems to be stuck at her current weight of 175. She wonders if her hormones could be to blame for this. She would like them checked. She is taking wellbutrin .     Review of Systems  All other systems reviewed and are negative.      Objective:   Physical Exam  Constitutional: She is oriented to person, place, and time. She appears well-developed and well-nourished.  HENT:  Head: Normocephalic and atraumatic.  Eyes: Conjunctivae are normal. Right eye exhibits no discharge. Left eye exhibits no discharge.  Cardiovascular: Normal rate, regular rhythm and normal heart sounds.   Pulmonary/Chest: Effort normal and breath sounds normal.  Neurological: She is alert and oriented to person, place, and time.  Psychiatric: She has a normal mood and affect. Her behavior is normal.          Assessment & Plan:  ADHD/Depression- on wellbutrin. Added adderall back. Follow up in 3 months.   Missed periods/moody/fatigue- discussed we could do some provera to restart cycle and have a bleed. She would like to hold on this. Will check fatigue panel and hormones. Discussed exercise, stress could all be reasons not loosing.

## 2016-02-26 DIAGNOSIS — N926 Irregular menstruation, unspecified: Secondary | ICD-10-CM | POA: Insufficient documentation

## 2016-02-26 DIAGNOSIS — R4589 Other symptoms and signs involving emotional state: Secondary | ICD-10-CM | POA: Insufficient documentation

## 2016-02-26 DIAGNOSIS — R5383 Other fatigue: Secondary | ICD-10-CM | POA: Insufficient documentation

## 2016-02-26 LAB — VITAMIN D 25 HYDROXY (VIT D DEFICIENCY, FRACTURES): VIT D 25 HYDROXY: 29 ng/mL — AB (ref 30–100)

## 2016-02-28 LAB — ESTROGENS, TOTAL: Estrogen: 231.6 pg/mL

## 2016-04-07 ENCOUNTER — Encounter: Payer: Self-pay | Admitting: Physician Assistant

## 2016-04-07 DIAGNOSIS — K2970 Gastritis, unspecified, without bleeding: Secondary | ICD-10-CM | POA: Insufficient documentation

## 2016-04-16 ENCOUNTER — Encounter: Payer: Self-pay | Admitting: Physician Assistant

## 2016-06-08 ENCOUNTER — Ambulatory Visit: Payer: BLUE CROSS/BLUE SHIELD | Admitting: Sports Medicine

## 2016-06-10 ENCOUNTER — Ambulatory Visit (INDEPENDENT_AMBULATORY_CARE_PROVIDER_SITE_OTHER): Payer: BLUE CROSS/BLUE SHIELD | Admitting: Sports Medicine

## 2016-06-10 ENCOUNTER — Encounter: Payer: Self-pay | Admitting: Sports Medicine

## 2016-06-10 DIAGNOSIS — B351 Tinea unguium: Secondary | ICD-10-CM | POA: Diagnosis not present

## 2016-06-10 DIAGNOSIS — M722 Plantar fascial fibromatosis: Secondary | ICD-10-CM

## 2016-06-10 MED ORDER — MELOXICAM 15 MG PO TABS: ORAL_TABLET | ORAL | 3 refills | Status: DC

## 2016-06-10 MED ORDER — TERBINAFINE HCL 250 MG PO TABS: 250.0000 mg | ORAL_TABLET | Freq: Every day | ORAL | 1 refills | Status: AC

## 2016-06-10 NOTE — Assessment & Plan Note (Signed)
Classic symptoms. Rehabilitation exercises, she will wear her orthotics more diligently, meloxicam. Return to see me in one month, injection if no better.

## 2016-06-10 NOTE — Assessment & Plan Note (Signed)
With onychodystrophy. Restarting Lamisil for 6 months, we will check LFTs at the follow-up visit.

## 2016-06-10 NOTE — Progress Notes (Signed)
  Subjective:    CC: Right foot pain  HPI: This is a pleasant 40 year old female with a history of plantar fasciitis, we made her custom orthotics and her pain resolved 2 years ago. Unfortunately she has been walking around barefoot and is having a recurrence of pain. Moderate, persistent, localized on the plantar aspect of the calcaneus, worse with the first few steps in the morning. She has not been airing her orthotics.  Onychodystrophy: Sometime ago we did try Lamisil, she did not complete the course. She has been clipping her nails herself.  Past medical history, Surgical history, Family history not pertinant except as noted below, Social history, Allergies, and medications have been entered into the medical record, reviewed, and no changes needed.   Review of Systems: No fevers, chills, night sweats, weight loss, chest pain, or shortness of breath.   Objective:    General: Well Developed, well nourished, and in no acute distress.  Neuro: Alert and oriented x3, extra-ocular muscles intact, sensation grossly intact.  HEENT: Normocephalic, atraumatic, pupils equal round reactive to light, neck supple, no masses, no lymphadenopathy, thyroid nonpalpable.  Skin: Warm and dry, no rashes. Cardiac: Regular rate and rhythm, no murmurs rubs or gallops, no lower extremity edema.  Respiratory: Clear to auscultation bilaterally. Not using accessory muscles, speaking in full sentences. Right Foot: No visible erythema or swelling. Range of motion is full in all directions. Strength is 5/5 in all directions. No hallux valgus. No pes cavus or pes planus. No abnormal callus noted. No pain over the navicular prominence, or base of fifth metatarsal. Moderately tender to palpation of the calcaneal insertion of plantar fascia. No pain at the Achilles insertion. No pain over the calcaneal bursa. No pain of the retrocalcaneal bursa. No tenderness to palpation over the tarsals, metatarsals, or  phalanges. No hallux rigidus or limitus. No tenderness palpation over interphalangeal joints. No pain with compression of the metatarsal heads. Neurovascularly intact distally. Both toenails stop midway down, they are somewhat thickened and yellow.  Impression and Recommendations:    Plantar fasciitis, right Classic symptoms. Rehabilitation exercises, she will wear her orthotics more diligently, meloxicam. Return to see me in one month, injection if no better.  Onychomycosis With onychodystrophy. Restarting Lamisil for 6 months, we will check LFTs at the follow-up visit.  I spent 25 minutes with this patient, greater than 50% was face-to-face time counseling regarding the above diagnoses

## 2016-06-24 DIAGNOSIS — N856 Intrauterine synechiae: Secondary | ICD-10-CM | POA: Insufficient documentation

## 2016-07-24 ENCOUNTER — Ambulatory Visit: Payer: BLUE CROSS/BLUE SHIELD | Admitting: Sports Medicine

## 2016-08-27 ENCOUNTER — Ambulatory Visit (INDEPENDENT_AMBULATORY_CARE_PROVIDER_SITE_OTHER): Payer: BLUE CROSS/BLUE SHIELD | Admitting: Sports Medicine

## 2016-08-27 ENCOUNTER — Encounter: Payer: Self-pay | Admitting: Sports Medicine

## 2016-08-27 DIAGNOSIS — M722 Plantar fascial fibromatosis: Secondary | ICD-10-CM

## 2016-08-27 DIAGNOSIS — J0100 Acute maxillary sinusitis, unspecified: Secondary | ICD-10-CM

## 2016-08-27 DIAGNOSIS — B351 Tinea unguium: Secondary | ICD-10-CM | POA: Diagnosis not present

## 2016-08-27 MED ORDER — FLUTICASONE PROPIONATE 50 MCG/ACT NA SUSP: NASAL | 3 refills | Status: DC

## 2016-08-27 MED ORDER — AMOXICILLIN-POT CLAVULANATE 875-125 MG PO TABS: 1.0000 | ORAL_TABLET | Freq: Two times a day (BID) | ORAL | 0 refills | Status: DC

## 2016-08-27 MED ORDER — DIAZEPAM 5 MG PO TABS: ORAL_TABLET | ORAL | 0 refills | Status: DC

## 2016-08-27 NOTE — Assessment & Plan Note (Signed)
Improvement after 2 months, she understands that she needs to do the treatment for 6-9 months. Checking LFTs

## 2016-08-27 NOTE — Progress Notes (Signed)
  Subjective:    CC: Follow-up multiple issues  HPI: Plantar fasciitis: Still painful, will make an appoint for an injection.  Onychomycosis: Slight improvement after 2 months of Lamisil.  Feeling sick: With sinus pain and pressure for 2 weeks now, radiation to the ears, no constitutional symptoms.  Past medical history:  Negative.  See flowsheet/record as well for more information.  Surgical history: Negative.  See flowsheet/record as well for more information.  Family history: Negative.  See flowsheet/record as well for more information.  Social history: Negative.  See flowsheet/record as well for more information.  Allergies, and medications have been entered into the medical record, reviewed, and no changes needed.   Review of Systems: No fevers, chills, night sweats, weight loss, chest pain, or shortness of breath.   Objective:    General: Well Developed, well nourished, and in no acute distress.  Neuro: Alert and oriented x3, extra-ocular muscles intact, sensation grossly intact.  HEENT: Normocephalic, atraumatic, pupils equal round reactive to light, neck supple, no masses, no lymphadenopathy, thyroid nonpalpable. Oropharynx, nasopharynx, ear canals unremarkable, minimal tenderness over the maxillary sinuses. Skin: Warm and dry, no rashes. Cardiac: Regular rate and rhythm, no murmurs rubs or gallops, no lower extremity edema.  Respiratory: Clear to auscultation bilaterally. Not using accessory muscles, speaking in full sentences.  Impression and Recommendations:    Plantar fasciitis, right Patient will return for injection, oral dissolving Valium to take beforehand. She knows that she needs a driver.  Onychomycosis Improvement after 2 months, she understands that she needs to do the treatment for 6-9 months. Checking LFTs  Acute maxillary sinusitis Augmentin, Flonase

## 2016-08-27 NOTE — Assessment & Plan Note (Signed)
Augmentin, Flonase. 

## 2016-08-27 NOTE — Assessment & Plan Note (Signed)
Patient will return for injection, oral dissolving Valium to take beforehand. She knows that she needs a driver.

## 2016-08-28 ENCOUNTER — Ambulatory Visit: Payer: BLUE CROSS/BLUE SHIELD | Admitting: Sports Medicine

## 2016-08-28 ENCOUNTER — Encounter: Payer: Self-pay | Admitting: Sports Medicine

## 2016-08-28 DIAGNOSIS — M722 Plantar fascial fibromatosis: Secondary | ICD-10-CM | POA: Diagnosis not present

## 2016-08-28 NOTE — Progress Notes (Signed)
  Subjective:    CC: Follow-up  HPI: Right foot pain: With plantar fasciitis, has failed conservative measures, agreeable to try injection today, pain is severe, persistent, localized without radiation.  Past medical history:  Negative.  See flowsheet/record as well for more information.  Surgical history: Negative.  See flowsheet/record as well for more information.  Family history: Negative.  See flowsheet/record as well for more information.  Social history: Negative.  See flowsheet/record as well for more information.  Allergies, and medications have been entered into the medical record, reviewed, and no changes needed.   Review of Systems: No fevers, chills, night sweats, weight loss, chest pain, or shortness of breath.   Objective:    General: Well Developed, well nourished, and in no acute distress.  Neuro: Alert and oriented x3, extra-ocular muscles intact, sensation grossly intact.  HEENT: Normocephalic, atraumatic, pupils equal round reactive to light, neck supple, no masses, no lymphadenopathy, thyroid nonpalpable.  Skin: Warm and dry, no rashes. Cardiac: Regular rate and rhythm, no murmurs rubs or gallops, no lower extremity edema.  Respiratory: Clear to auscultation bilaterally. Not using accessory muscles, speaking in full sentences. Right Foot: No visible erythema or swelling. Range of motion is full in all directions. Strength is 5/5 in all directions. No hallux valgus. No pes cavus or pes planus. No abnormal callus noted. No pain over the navicular prominence, or base of fifth metatarsal. Tender to palpation of the calcaneal insertion of plantar fascia. No pain at the Achilles insertion. No pain over the calcaneal bursa. No pain of the retrocalcaneal bursa. No tenderness to palpation over the tarsals, metatarsals, or phalanges. No hallux rigidus or limitus. No tenderness palpation over interphalangeal joints. No pain with compression of the metatarsal  heads. Neurovascularly intact distally.  Procedure: Real-time Ultrasound Guided Injection of right plantar fascial origin Device: GE Logiq E  Verbal informed consent obtained.  Time-out conducted.  Noted no overlying erythema, induration, or other signs of local infection.  Skin prepped in a sterile fashion.  Local anesthesia: Topical Ethyl chloride.  With sterile technique and under real time ultrasound guidance:  1 mL Kenalog 40, 1 mL lidocaine, 1 mL Marcaine injected easily. Completed without difficulty  Pain immediately resolved suggesting accurate placement of the medication.  Advised to call if fevers/chills, erythema, induration, drainage, or persistent bleeding.  Images permanently stored and available for review in the ultrasound unit.  Impression: Technically successful ultrasound guided injection.  Impression and Recommendations:    Plantar fasciitis, right Plantar fascia injection as above, return in one month.

## 2016-08-28 NOTE — Assessment & Plan Note (Signed)
Plantar fascia injection as above, return in one month.

## 2016-09-02 ENCOUNTER — Ambulatory Visit (INDEPENDENT_AMBULATORY_CARE_PROVIDER_SITE_OTHER): Payer: BLUE CROSS/BLUE SHIELD | Admitting: Physician Assistant

## 2016-09-02 ENCOUNTER — Encounter: Payer: Self-pay | Admitting: Physician Assistant

## 2016-09-02 VITALS — BP 116/81 | HR 99 | Temp 98.2°F | Ht 69.0 in | Wt 183.0 lb

## 2016-09-02 DIAGNOSIS — R109 Unspecified abdominal pain: Secondary | ICD-10-CM

## 2016-09-02 DIAGNOSIS — R319 Hematuria, unspecified: Secondary | ICD-10-CM

## 2016-09-02 DIAGNOSIS — R1031 Right lower quadrant pain: Secondary | ICD-10-CM | POA: Diagnosis not present

## 2016-09-02 DIAGNOSIS — R1115 Cyclical vomiting syndrome unrelated to migraine: Secondary | ICD-10-CM

## 2016-09-02 DIAGNOSIS — R1032 Left lower quadrant pain: Secondary | ICD-10-CM

## 2016-09-02 DIAGNOSIS — G43A0 Cyclical vomiting, not intractable: Secondary | ICD-10-CM | POA: Diagnosis not present

## 2016-09-02 DIAGNOSIS — R10A3 Flank pain, bilateral: Secondary | ICD-10-CM

## 2016-09-02 LAB — POCT URINALYSIS DIPSTICK
Bilirubin, UA: NEGATIVE
Glucose, UA: NEGATIVE
Ketones, UA: NEGATIVE
LEUKOCYTES UA: NEGATIVE
Nitrite, UA: NEGATIVE
Protein, UA: NEGATIVE
SPEC GRAV UA: 1.025
UROBILINOGEN UA: 0.2
pH, UA: 6

## 2016-09-02 MED ORDER — PROMETHAZINE HCL 25 MG PO TABS: 25.0000 mg | ORAL_TABLET | Freq: Four times a day (QID) | ORAL | 0 refills | Status: DC | PRN

## 2016-09-02 NOTE — Patient Instructions (Addendum)
delsym  Viral Gastroenteritis, Adult Introduction Viral gastroenteritis is also known as the stomach flu. This condition is caused by certain germs (viruses). These germs can be passed from person to person very easily (are very contagious). This condition can cause sudden watery poop (diarrhea), fever, and throwing up (vomiting). Having watery poop and throwing up can make you feel weak and cause you to get dehydrated. Dehydration can make you tired and thirsty, make you have a dry mouth, and make it so you pee (urinate) less often. Older adults and people with other diseases or a weak defense system (immune system) are at higher risk for dehydration. It is important to replace the fluids that you lose from having watery poop and throwing up. Follow these instructions at home: Follow instructions from your doctor about how to care for yourself at home. Eating and drinking Follow these instructions as told by your doctor:  Take an oral rehydration solution (ORS). This is a drink that is sold at pharmacies and stores.  Drink clear fluids in small amounts as you are able, such as:  Water.  Ice chips.  Diluted fruit juice.  Low-calorie sports drinks.  Eat bland, easy-to-digest foods in small amounts as you are able, such as:  Bananas.  Applesauce.  Rice.  Low-fat (lean) meats.  Toast.  Crackers.  Avoid fluids that have a lot of sugar or caffeine in them.  Avoid alcohol.  Avoid spicy or fatty foods. General instructions  Drink enough fluid to keep your pee (urine) clear or pale yellow.  Wash your hands often. If you cannot use soap and water, use hand sanitizer.  Make sure that all people in your home wash their hands well and often.  Rest at home while you get better.  Take over-the-counter and prescription medicines only as told by your doctor.  Watch your condition for any changes.  Take a warm bath to help with any burning or pain from having watery  poop.  Keep all follow-up visits as told by your doctor. This is important. Contact a doctor if:  You cannot keep fluids down.  Your symptoms get worse.  You have new symptoms.  You feel light-headed or dizzy.  You have muscle cramps. Get help right away if:  You have chest pain.  You feel very weak or you pass out (faint).  You see blood in your throw-up.  Your throw-up looks like coffee grounds.  You have bloody or black poop (stools) or poop that look like tar.  You have a very bad headache, a stiff neck, or both.  You have a rash.  You have very bad pain, cramping, or bloating in your belly (abdomen).  You have trouble breathing.  You are breathing very quickly.  Your heart is beating very quickly.  Your skin feels cold and clammy.  You feel confused.  You have pain when you pee.  You have signs of dehydration, such as:  Dark pee, hardly any pee, or no pee.  Cracked lips.  Dry mouth.  Sunken eyes.  Sleepiness.  Weakness. This information is not intended to replace advice given to you by your health care provider. Make sure you discuss any questions you have with your health care provider. Document Released: 03/02/2008 Document Revised: 04/03/2016 Document Reviewed: 05/21/2015  2017 [employer]

## 2016-09-02 NOTE — Progress Notes (Addendum)
   Subjective:    Patient ID: Haley Church, female    DOB: 1976-06-11, 40 y.o.   MRN: [id number]  HPI Patient is a 40 yo female coming to the clinic today with complaints of nausea and vomiting with diarrhea that started at midnight and lasted until six o'clock this morning. Patient reports that she was vomiting every 30 minutes along with diarrhea. She also reports cramping while experiencing vomiting and diarrhea. She rates the cramping pain as a 10/10. Patient is no longer experiencing nausea, vomiting, diarrhea or cramping. She reports that she began having lower back pain that started after she experienced vomiting and diarrhea which caused her to come in today. She has used a heating pad for her lower back. Patient reports feeling tired and fatigued. Patient denies dysuria, and urinary frequency. Patient denies fever. She has been drinking Gatorade and sprite and was able to keep down chicken noodle soup around lunch time.   LMP: 08/21/16 (approximately)    Review of Systems  Constitutional: Positive for appetite change and fatigue. Negative for fever.  Gastrointestinal: Positive for abdominal pain, diarrhea and vomiting.  Genitourinary: Negative for dysuria, frequency and urgency.   Urinalysis Urine dipstick: Trace blood, negative nitrates, negative leukocytes      Objective:   Physical Exam  Constitutional: She is oriented to person, place, and time. She appears well-developed and well-nourished.  Cardiovascular: Normal rate and regular rhythm.  Exam reveals no gallop and no friction rub.   No murmur heard. Pulmonary/Chest: Effort normal and breath sounds normal. No respiratory distress. She has no wheezes. She has no rales.  Abdominal: Soft. She exhibits no distension. There is tenderness (Tenderness to lower quadrants bilaterally. ).  Musculoskeletal:  Negative CVA tenderness. Lower back muscle tenderness to palpation.   Neurological: She is alert and oriented to person, place,  and time.  Skin: Skin is warm and dry. No rash noted. There is pallor.      Assessment & Plan:   Diagnoses and all orders for this visit:  1. Bilateral flank pain likely due to lower back muscle strain  - POCT urinalysis dipstick due to patient experiencing lower back pain. Results showed a trace of blood, negative leukocytes and nitrates.  - Urine Culture due to trace hematuria.   2. Non-intractable cyclical vomiting with nausea -Prescribed promethazine (PHENERGAN) 25 MG tablet; Take 1 tablet (25 mg total) by mouth every 6 (six) hours as needed for nausea or vomiting.  3. Bilateral lower abdominal cramping     -If symptoms worsen, call the office and follow-up  4. Hematuria, unspecified type    - Urine Culture due to trace hematuria.   Symptoms are likely due to viral gastroenteritis and flank pain due to muscle spasm from vomiting and diarrhea.  Suggested hydration, BRAT diet, heating pad, rest, tylenol for pain. Follow up as needed.

## 2016-09-03 LAB — URINE CULTURE: Organism ID, Bacteria: NO GROWTH

## 2016-10-05 ENCOUNTER — Encounter: Payer: Self-pay | Admitting: Sports Medicine

## 2016-10-05 ENCOUNTER — Ambulatory Visit (INDEPENDENT_AMBULATORY_CARE_PROVIDER_SITE_OTHER): Payer: BLUE CROSS/BLUE SHIELD | Admitting: Sports Medicine

## 2016-10-05 DIAGNOSIS — M722 Plantar fascial fibromatosis: Secondary | ICD-10-CM | POA: Diagnosis not present

## 2016-10-05 NOTE — Progress Notes (Signed)
  Subjective:    CC: Follow-up  HPI: Right plantar fasciitis: Improved significantly after injection, does get some pain but only during exercise and hopping.  Past medical history:  Negative.  See flowsheet/record as well for more information.  Surgical history: Negative.  See flowsheet/record as well for more information.  Family history: Negative.  See flowsheet/record as well for more information.  Social history: Negative.  See flowsheet/record as well for more information.  Allergies, and medications have been entered into the medical record, reviewed, and no changes needed.   Review of Systems: No fevers, chills, night sweats, weight loss, chest pain, or shortness of breath.   Objective:    General: Well Developed, well nourished, and in no acute distress.  Neuro: Alert and oriented x3, extra-ocular muscles intact, sensation grossly intact.  HEENT: Normocephalic, atraumatic, pupils equal round reactive to light, neck supple, no masses, no lymphadenopathy, thyroid nonpalpable.  Skin: Warm and dry, no rashes. Cardiac: Regular rate and rhythm, no murmurs rubs or gallops, no lower extremity edema.  Respiratory: Clear to auscultation bilaterally. Not using accessory muscles, speaking in full sentences. Right Foot: No visible erythema or swelling. Range of motion is full in all directions. Strength is 5/5 in all directions. No hallux valgus. No pes cavus or pes planus. No abnormal callus noted. No pain over the navicular prominence, or base of fifth metatarsal. No tenderness to palpation of the calcaneal insertion of plantar fascia. No pain at the Achilles insertion. No pain over the calcaneal bursa. No pain of the retrocalcaneal bursa. No tenderness to palpation over the tarsals, metatarsals, or phalanges. No hallux rigidus or limitus. No tenderness palpation over interphalangeal joints. No pain with compression of the metatarsal heads. Neurovascularly intact  distally.  Impression and Recommendations:    Plantar fasciitis, right Fantastic improvement after injection but still has occasional pain with exercise. I have placed her in an air heel brace. Return in one month.  I spent 25 minutes with this patient, greater than 50% was face-to-face time counseling regarding the above diagnoses

## 2016-10-05 NOTE — Assessment & Plan Note (Signed)
Fantastic improvement after injection but still has occasional pain with exercise. I have placed her in an air heel brace. Return in one month.

## 2016-10-19 ENCOUNTER — Encounter: Payer: Self-pay | Admitting: Physician Assistant

## 2016-10-19 ENCOUNTER — Ambulatory Visit (INDEPENDENT_AMBULATORY_CARE_PROVIDER_SITE_OTHER): Payer: BLUE CROSS/BLUE SHIELD | Admitting: Physician Assistant

## 2016-10-19 VITALS — BP 117/87 | HR 101 | Ht 69.0 in | Wt 192.0 lb

## 2016-10-19 DIAGNOSIS — F900 Attention-deficit hyperactivity disorder, predominantly inattentive type: Secondary | ICD-10-CM | POA: Diagnosis not present

## 2016-10-19 DIAGNOSIS — F411 Generalized anxiety disorder: Secondary | ICD-10-CM | POA: Diagnosis not present

## 2016-10-19 MED ORDER — AMPHETAMINE-DEXTROAMPHET ER 20 MG PO CP24: 20.0000 mg | ORAL_CAPSULE | ORAL | 0 refills | Status: DC

## 2016-10-19 MED ORDER — CLONAZEPAM 0.5 MG PO TABS: 0.5000 mg | ORAL_TABLET | Freq: Every day | ORAL | 5 refills | Status: DC

## 2016-10-19 NOTE — Progress Notes (Signed)
   Subjective:    Patient ID: Haley Church, female    DOB: 1975/12/27, 41 y.o.   MRN: [id number]  HPI Pt is a 41 yo female who presents to the clinic for ADHD follow up. She has been taking only as needed for the last few months and most of days not at all. She continues to have problems with completing task. She doesn't like the way 30mg  made her feel moody but admits she needs something.   Needs refill on klonapin. Taking 3-4 times a week. Not had refill in months.    Review of Systems  All other systems reviewed and are negative.      Objective:   Physical Exam  Constitutional: She is oriented to person, place, and time. She appears well-developed and well-nourished.  HENT:  Head: Normocephalic and atraumatic.  Cardiovascular: Normal rate, regular rhythm and normal heart sounds.   Pulmonary/Chest: Effort normal and breath sounds normal.  Neurological: She is alert and oriented to person, place, and time.  Psychiatric: She has a normal mood and affect. Her behavior is normal.          Assessment & Plan:  [name].[name].Alina was seen today for adhd.  Diagnoses and all orders for this visit:  Attention deficit hyperactivity disorder (ADHD), predominantly inattentive type -     amphetamine-dextroamphetamine (ADDERALL XR) 20 MG 24 hr capsule; Take 1 capsule (20 mg total) by mouth every morning.  Anxiety state -     clonazePAM (KLONOPIN) 0.5 MG tablet; Take 1 tablet (0.5 mg total) by mouth daily.  Other orders -     Discontinue: amphetamine-dextroamphetamine (ADDERALL XR) 20 MG 24 hr capsule; Take 1 capsule (20 mg total) by mouth every morning. -     Discontinue: amphetamine-dextroamphetamine (ADDERALL XR) 20 MG 24 hr capsule; Take 1 capsule (20 mg total) by mouth every morning.   Discussed to continue to take klonopin only as needed.   Follow up in 3 months. Decreased adderall dose to see if would help with moodiness.

## 2016-11-02 ENCOUNTER — Ambulatory Visit: Payer: BLUE CROSS/BLUE SHIELD | Admitting: Sports Medicine

## 2016-12-11 ENCOUNTER — Encounter: Payer: Self-pay | Admitting: Sports Medicine

## 2016-12-11 ENCOUNTER — Ambulatory Visit (INDEPENDENT_AMBULATORY_CARE_PROVIDER_SITE_OTHER): Payer: BLUE CROSS/BLUE SHIELD | Admitting: Sports Medicine

## 2016-12-11 DIAGNOSIS — K529 Noninfective gastroenteritis and colitis, unspecified: Secondary | ICD-10-CM | POA: Insufficient documentation

## 2016-12-11 MED ORDER — DIPHENOXYLATE-ATROPINE 2.5-0.025 MG PO TABS: ORAL_TABLET | ORAL | 3 refills | Status: DC

## 2016-12-11 MED ORDER — PROMETHAZINE HCL 25 MG PO TABS: 25.0000 mg | ORAL_TABLET | Freq: Four times a day (QID) | ORAL | 3 refills | Status: DC | PRN

## 2016-12-11 MED ORDER — ONDANSETRON 8 MG PO TBDP: 8.0000 mg | ORAL_TABLET | Freq: Three times a day (TID) | ORAL | 3 refills | Status: DC | PRN

## 2016-12-11 NOTE — Patient Instructions (Signed)

## 2016-12-11 NOTE — Assessment & Plan Note (Signed)
Adding Lomotil, Phenergan, Zofran, patient will push hydration. Stool studies. Return if no better in 2 weeks.

## 2016-12-11 NOTE — Progress Notes (Signed)
  Subjective:    CC: Diarrhea  HPI: For 5 days this 41 year old female has had nausea, vomiting, diarrhea, overall improving. She is able to keep down oral fluids and food. No fevers, chills, no rash, no dentures in, she hasn't left the country. Symptoms are mild, improving.  Past medical history:  Negative.  See flowsheet/record as well for more information.  Surgical history: Negative.  See flowsheet/record as well for more information.  Family history: Negative.  See flowsheet/record as well for more information.  Social history: Negative.  See flowsheet/record as well for more information.  Allergies, and medications have been entered into the medical record, reviewed, and no changes needed.   Review of Systems: No fevers, chills, night sweats, weight loss, chest pain, or shortness of breath.   Objective:    General: Well Developed, well nourished, and in no acute distress.  Neuro: Alert and oriented x3, extra-ocular muscles intact, sensation grossly intact.  HEENT: Normocephalic, atraumatic, pupils equal round reactive to light, neck supple, no masses, no lymphadenopathy, thyroid nonpalpable.  Skin: Warm and dry, no rashes. Cardiac: Regular rate and rhythm, no murmurs rubs or gallops, no lower extremity edema.  Respiratory: Clear to auscultation bilaterally. Not using accessory muscles, speaking in full sentences. Abdomen: Soft, nontender, nondistended, normal bowel sounds, no palpable masses, no guarding, rigidity, rebound pain.  Impression and Recommendations:    Gastroenteritis Adding Lomotil, Phenergan, Zofran, patient will push hydration. Stool studies. Return if no better in 2 weeks.  I spent 25 minutes with this patient, greater than 50% was face-to-face time counseling regarding the above diagnoses

## 2017-06-28 ENCOUNTER — Ambulatory Visit (INDEPENDENT_AMBULATORY_CARE_PROVIDER_SITE_OTHER): Payer: BLUE CROSS/BLUE SHIELD | Admitting: Physician Assistant

## 2017-06-28 ENCOUNTER — Encounter: Payer: Self-pay | Admitting: Physician Assistant

## 2017-06-28 VITALS — BP 126/70 | HR 85 | Temp 98.2°F | Ht 69.0 in | Wt 198.0 lb

## 2017-06-28 DIAGNOSIS — R059 Cough, unspecified: Secondary | ICD-10-CM

## 2017-06-28 DIAGNOSIS — R05 Cough: Secondary | ICD-10-CM

## 2017-06-28 DIAGNOSIS — J0100 Acute maxillary sinusitis, unspecified: Secondary | ICD-10-CM | POA: Diagnosis not present

## 2017-06-28 DIAGNOSIS — F411 Generalized anxiety disorder: Secondary | ICD-10-CM

## 2017-06-28 MED ORDER — GUAIFENESIN-CODEINE 100-10 MG/5ML PO SYRP: 5.0000 mL | ORAL_SOLUTION | Freq: Three times a day (TID) | ORAL | 0 refills | Status: DC | PRN

## 2017-06-28 MED ORDER — AZITHROMYCIN 250 MG PO TABS: ORAL_TABLET | ORAL | 0 refills | Status: DC

## 2017-06-28 MED ORDER — BENZONATATE 200 MG PO CAPS: 200.0000 mg | ORAL_CAPSULE | Freq: Two times a day (BID) | ORAL | 0 refills | Status: DC | PRN

## 2017-06-28 MED ORDER — LISDEXAMFETAMINE DIMESYLATE 20 MG PO CAPS: 20.0000 mg | ORAL_CAPSULE | ORAL | 0 refills | Status: DC

## 2017-06-28 MED ORDER — CLONAZEPAM 0.5 MG PO TABS: 0.5000 mg | ORAL_TABLET | Freq: Every day | ORAL | 2 refills | Status: DC

## 2017-06-28 NOTE — Progress Notes (Signed)
Subjective:    Patient ID: Haley Church, female    DOB: 1976/01/25, 41 y.o.   MRN: [id number]  HPI  Pt is a 41 yo female who presents to the clinic with sinus pressure, cough, sinus drainage for over 1 week. She thought orginally it was allergies. Symptoms have persistent despite mucinex and dayquil. At times sputum is yellow. No fever, chills. Cough is worse at night and dark colored.   ADHD- would like to go back on stimulant. Pt would like to try vyvanse.   She would also like to have some klnapin as needed. At times anxiety can get so bad that she feels like heart racing and cannot settle down.   .. Active Ambulatory Problems    Diagnosis Date Noted  . Subacromial bursitis 10/03/2013  . ADHD (attention deficit hyperactivity disorder) 10/06/2013  . Plantar fasciitis, right 10/31/2013  . Onychomycosis 11/02/2013  . Biceps tendinitis on left 11/06/2013  . Metatarsal fracture 10/10/2014  . Anxiety as acute reaction to exceptional stress 12/17/2014  . Depression 09/03/2015  . History of miscarriage 12/10/2015  . Other fatigue 02/26/2016  . Missed periods 02/26/2016  . Moody 02/26/2016  . Gastritis 04/07/2016  . Acute maxillary sinusitis 08/27/2016  . Gastroenteritis 12/11/2016  . Anxiety state 06/30/2017   Resolved Ambulatory Problems    Diagnosis Date Noted  . Postpartum care following vaginal delivery (6/16) 03/14/2013  . Sebaceous cyst 05/15/2014   Past Medical History:  Diagnosis Date  . Anxiety   . Depression     Review of Systems  All other systems reviewed and are negative.      Objective:   Physical Exam  Constitutional: She is oriented to person, place, and time. She appears well-developed and well-nourished.  HENT:  Head: Normocephalic and atraumatic.  Right Ear: External ear normal.  Left Ear: External ear normal.  Tenderness over maxillary sinuses to palpation.  Oropharynx erythematous without tonsilar swelling or exudate.  Bilateral nasal  turbinates red and swollen.   Eyes: Conjunctivae are normal. Right eye exhibits no discharge. Left eye exhibits no discharge.  Neck: Normal range of motion. Neck supple.  Cardiovascular: Normal rate, regular rhythm and normal heart sounds.   Pulmonary/Chest: Effort normal and breath sounds normal. She has no wheezes.  Lymphadenopathy:    She has no cervical adenopathy.  Neurological: She is alert and oriented to person, place, and time.  Psychiatric: She has a normal mood and affect. Her behavior is normal.          Assessment & Plan:  Marland KitchenMarland KitchenAmber was seen today for cough, headache and otalgia.  Diagnoses and all orders for this visit:  Acute non-recurrent maxillary sinusitis -     azithromycin (ZITHROMAX) 250 MG tablet; Take 2 tablet now and then one tablet for 4 days. -     benzonatate (TESSALON) 200 MG capsule; Take 1 capsule (200 mg total) by mouth 2 (two) times daily as needed for cough. -     guaiFENesin-codeine (ROBITUSSIN AC) 100-10 MG/5ML syrup; Take 5 mLs by mouth 3 (three) times daily as needed for cough.  Anxiety state -     clonazePAM (KLONOPIN) 0.5 MG tablet; Take 1 tablet (0.5 mg total) by mouth daily.  Cough -     benzonatate (TESSALON) 200 MG capsule; Take 1 capsule (200 mg total) by mouth 2 (two) times daily as needed for cough. -     guaiFENesin-codeine (ROBITUSSIN AC) 100-10 MG/5ML syrup; Take 5 mLs by mouth 3 (three) times daily as  needed for cough.  Other orders -     Discontinue: lisdexamfetamine (VYVANSE) 20 MG capsule; Take 1 capsule (20 mg total) by mouth every morning.   HO given.  Restarted vyvanse. Discussed side effects. Follow up in 1 month.  Discussed abuse potential of klonapin. Use only as needed.

## 2017-06-28 NOTE — Patient Instructions (Signed)

## 2017-06-29 MED ORDER — AMPHETAMINE-DEXTROAMPHETAMINE 10 MG PO TABS: 10.0000 mg | ORAL_TABLET | Freq: Two times a day (BID) | ORAL | 0 refills | Status: DC

## 2017-06-29 NOTE — Telephone Encounter (Signed)
vyvanse is too expensive. Sent adderall  bid to try. Follow up in 1 month.

## 2017-06-30 ENCOUNTER — Encounter: Payer: Self-pay | Admitting: Physician Assistant

## 2017-06-30 DIAGNOSIS — F411 Generalized anxiety disorder: Secondary | ICD-10-CM | POA: Insufficient documentation

## 2017-07-27 LAB — OB RESULTS CONSOLE ANTIBODY SCREEN: ANTIBODY SCREEN: NEGATIVE

## 2017-07-27 LAB — OB RESULTS CONSOLE ABO/RH: RH Type: POSITIVE

## 2017-07-27 LAB — OB RESULTS CONSOLE GC/CHLAMYDIA
CHLAMYDIA, DNA PROBE: NEGATIVE
GC PROBE AMP, GENITAL: NEGATIVE

## 2017-07-27 LAB — OB RESULTS CONSOLE RPR: RPR: NONREACTIVE

## 2017-07-27 LAB — OB RESULTS CONSOLE GBS: STREP GROUP B AG: POSITIVE

## 2017-07-27 LAB — OB RESULTS CONSOLE HIV ANTIBODY (ROUTINE TESTING): HIV: NONREACTIVE

## 2017-07-27 LAB — OB RESULTS CONSOLE RUBELLA ANTIBODY, IGM: Rubella: IMMUNE

## 2017-07-27 LAB — OB RESULTS CONSOLE HEPATITIS B SURFACE ANTIGEN: HEP B S AG: NEGATIVE

## 2017-08-23 ENCOUNTER — Ambulatory Visit: Payer: BLUE CROSS/BLUE SHIELD | Admitting: Physician Assistant

## 2017-08-23 ENCOUNTER — Encounter: Payer: Self-pay | Admitting: Physician Assistant

## 2017-08-23 VITALS — BP 122/80 | HR 115 | Temp 98.2°F | Ht 69.02 in | Wt 203.0 lb

## 2017-08-23 DIAGNOSIS — H109 Unspecified conjunctivitis: Secondary | ICD-10-CM | POA: Diagnosis not present

## 2017-08-23 DIAGNOSIS — H6982 Other specified disorders of Eustachian tube, left ear: Secondary | ICD-10-CM | POA: Diagnosis not present

## 2017-08-23 DIAGNOSIS — J3489 Other specified disorders of nose and nasal sinuses: Secondary | ICD-10-CM

## 2017-08-23 MED ORDER — POLYMYXIN B-TRIMETHOPRIM 10000-0.1 UNIT/ML-% OP SOLN: 1.0000 [drp] | OPHTHALMIC | 0 refills | Status: DC

## 2017-08-23 NOTE — Progress Notes (Signed)
Subjective:    Patient ID: Haley Church, female    DOB: 11/17/1975, [age][address][age] y.o.   MRN: [id number]  HPI  Pt is a 41 yo pleasant female who is [redacted] weeks pregnant who presents to the clinic left eye pain and discharge. She has had symptoms for last 2 days. She has taken tylenol and used a rag to open left eye in morning after waking up to being crusted shut. No fever, chills, SOB, wheezing. She does have some left ear pain off and on and left sided sinus pressure off and on. She wakes up in the morning and has to "cough up stuff" but then fine throughout the day.   .. Active Ambulatory Problems    Diagnosis Date Noted  . Subacromial bursitis 10/03/2013  . ADHD (attention deficit hyperactivity disorder) 10/06/2013  . Plantar fasciitis, right 10/31/2013  . Onychomycosis 11/02/2013  . Biceps tendinitis on left 11/06/2013  . Metatarsal fracture 10/10/2014  . Anxiety as acute reaction to exceptional stress 12/17/2014  . Depression 09/03/2015  . History of miscarriage 12/10/2015  . Other fatigue 02/26/2016  . Missed periods 02/26/2016  . Moody 02/26/2016  . Gastritis 04/07/2016  . Acute maxillary sinusitis 08/27/2016  . Gastroenteritis 12/11/2016  . Anxiety state 06/30/2017   Resolved Ambulatory Problems    Diagnosis Date Noted  . Postpartum care following vaginal delivery (6/16) 03/14/2013  . Sebaceous cyst 05/15/2014   Past Medical History:  Diagnosis Date  . Anxiety   . Depression     Review of Systems     Objective:   Physical Exam  Constitutional: She is oriented to person, place, and time. She appears well-developed and well-nourished.  HENT:  Head: Normocephalic and atraumatic.  Right Ear: External ear normal.  Left Ear: External ear normal.  Nose: Nose normal.  Mouth/Throat: Oropharynx is clear and moist. No oropharyngeal exudate.  TM"s clear bilaterally.  Tenderness over left frontal, ethmoid sinuses to palpation.  No active rhinorrhea.  Bilateral nares red and  swollen.   Eyes:  Left eye is swollen externally and conjunctiva injected. No active discharge today.   Neck: Normal range of motion. Neck supple.  Cardiovascular: Regular rhythm and normal heart sounds.  Tachycardic today.   Pulmonary/Chest: Effort normal and breath sounds normal.  Lymphadenopathy:    She has no cervical adenopathy.  Neurological: She is alert and oriented to person, place, and time.  Psychiatric: She has a normal mood and affect. Her behavior is normal.          Assessment & Plan:  [name].[name].Sonda was seen today for eye pain and left ear.  Diagnoses and all orders for this visit:  Bacterial conjunctivitis of left eye -     trimethoprim-polymyxin b (POLYTRIM) ophthalmic solution; Place 1 drop into the left eye every 4 (four) hours. For 7 days.  Eustachian tube dysfunction, left  Sinus pressure  treated for bacterial conjunctivitis of left eye. Discussed cool compresses and tylenol.  I did not see any signs of cellulitis. Certainly some of her symptoms seem consisent with sinus infection forming. I would like to give some more time and see if some symptomatic treatment could help with this before starting abx. Consider saline rinses and mucinex first. Ears on exam are normal appearance. I suspect pain is coming from ETD. Discussed chewing gum consider flonase for a few days. Pt is pregnant and she is wanting to limit oral medications. Follow up as needed or if symptoms worsen. If still having sinus symptoms will  call in abx for patient.

## 2017-08-23 NOTE — Patient Instructions (Signed)
Eustachian Tube Dysfunction The eustachian tube connects the middle ear to the back of the nose. It regulates air pressure in the middle ear by allowing air to move between the ear and nose. It also helps to drain fluid from the middle ear space. When the eustachian tube does not function properly, air pressure, fluid, or both can build up in the middle ear. Eustachian tube dysfunction can affect one or both ears. What are the causes? This condition happens when the eustachian tube becomes blocked or cannot open normally. This may result from:  Ear infections.  Colds and other upper respiratory infections.  Allergies.  Irritation, such as from cigarette smoke or acid from the stomach coming up into the esophagus (gastroesophageal reflux).  Sudden changes in air pressure, such as from descending in an airplane.  Abnormal growths in the nose or throat, such as nasal polyps, tumors, or enlarged tissue at the back of the throat (adenoids).  What increases the risk? This condition may be more likely to develop in people who smoke and people who are overweight. Eustachian tube dysfunction may also be more likely to develop in children, especially children who have:  Certain birth defects of the mouth, such as cleft palate.  Large tonsils and adenoids.  What are the signs or symptoms? Symptoms of this condition may include:  A feeling of fullness in the ear.  Ear pain.  Clicking or popping noises in the ear.  Ringing in the ear.  Hearing loss.  Loss of balance.  Symptoms may get worse when the air pressure around you changes, such as when you travel to an area of high elevation or fly on an airplane. How is this diagnosed? This condition may be diagnosed based on:  Your symptoms.  A physical exam of your ear, nose, and throat.  Tests, such as those that measure: ? The movement of your eardrum (tympanogram). ? Your hearing (audiometry).  How is this treated? Treatment  depends on the cause and severity of your condition. If your symptoms are mild, you may be able to relieve your symptoms by moving air into ("popping") your ears. If you have symptoms of fluid in your ears, treatment may include:  Decongestants.  Antihistamines.  Nasal sprays or ear drops that contain medicines that reduce swelling (steroids).  In some cases, you may need to have a procedure to drain the fluid in your eardrum (myringotomy). In this procedure, a small tube is placed in the eardrum to:  Drain the fluid.  Restore the air in the middle ear space.  Follow these instructions at home:  Take over-the-counter and prescription medicines only as told by your health care provider.  Use techniques to help pop your ears as recommended by your health care provider. These may include: ? Chewing gum. ? Yawning. ? Frequent, forceful swallowing. ? Closing your mouth, holding your nose closed, and gently blowing as if you are trying to blow air out of your nose.  Do not do any of the following until your health care provider approves: ? Travel to high altitudes. ? Fly in airplanes. ? Work in a pressurized cabin or room. ? Scuba dive.  Keep your ears dry. Dry your ears completely after showering or bathing.  Do not smoke.  Keep all follow-up visits as told by your health care provider. This is important. Contact a health care provider if:  Your symptoms do not go away after treatment.  Your symptoms come back after treatment.  You are   unable to pop your ears.  You have: ? A fever. ? Pain in your ear. ? Pain in your head or neck. ? Fluid draining from your ear.  Your hearing suddenly changes.  You become very dizzy.  You lose your balance. This information is not intended to replace advice given to you by your health care provider. Make sure you discuss any questions you have with your health care provider. Document Released: 10/11/2015 Document Revised: 02/20/2016  Document Reviewed: 10/03/2014 Elsevier Interactive Patient Education  2018 Elsevier Inc.  

## 2017-10-21 ENCOUNTER — Ambulatory Visit: Payer: BLUE CROSS/BLUE SHIELD | Admitting: Family Medicine

## 2017-10-21 ENCOUNTER — Encounter: Payer: Self-pay | Admitting: Family Medicine

## 2017-10-21 VITALS — BP 134/68 | HR 127 | Temp 99.1°F | Ht 69.0 in | Wt 214.0 lb

## 2017-10-21 DIAGNOSIS — R509 Fever, unspecified: Secondary | ICD-10-CM | POA: Diagnosis not present

## 2017-10-21 DIAGNOSIS — R6889 Other general symptoms and signs: Secondary | ICD-10-CM

## 2017-10-21 LAB — POCT INFLUENZA A/B
Influenza A, POC: NEGATIVE
Influenza B, POC: NEGATIVE

## 2017-10-21 NOTE — Progress Notes (Signed)
   Subjective:    Patient ID: Haley Church, female    DOB: 05-Jul-1976, 42 y.o.   MRN: [id number]  HPI 42 year old female who is [redacted] weeks pregnant is here today with upper respiratory symptoms that started this morning.  She is got some pressure in her sinuses but no significant congestion and now cough with some burning in her upper chest.. + fever, chills and cough. She has been using Mucinex.  She did not receive a flu vaccine this year.  No nausea or vomiting.  Review of Systems     Objective:   Physical Exam  Constitutional: She is oriented to person, place, and time. She appears well-developed and well-nourished.  HENT:  Head: Normocephalic and atraumatic.  Right Ear: External ear normal.  Left Ear: External ear normal.  Nose: Nose normal.  Mouth/Throat: Oropharynx is clear and moist.  TMs and canals are clear.   Eyes: Conjunctivae and EOM are normal. Pupils are equal, round, and reactive to light.  Neck: Neck supple. No thyromegaly present.  Cardiovascular: Normal rate, regular rhythm and normal heart sounds.  Pulmonary/Chest: Effort normal and breath sounds normal. She has no wheezes.  Lymphadenopathy:    She has no cervical adenopathy.  Neurological: She is alert and oriented to person, place, and time.  Skin: Skin is warm and dry.  Psychiatric: She has a normal mood and affect.       Assessment & Plan:  URI/flu like virus.  -flu test negative.  Possibly viral.  Though she does have flulike symptoms.  Observe over the next 24 hours and see if improving or getting worse.  If she starts to spike high fever please let us know.

## 2017-10-21 NOTE — Patient Instructions (Addendum)
Upper Respiratory Infection, Adult Most upper respiratory infections (URIs) are caused by a virus. A URI affects the nose, throat, and upper air passages. The most common type of URI is often called "the common cold." Follow these instructions at home:  Take medicines only as told by your doctor.  Gargle warm saltwater or take cough drops to comfort your throat as told by your doctor.  Use a warm mist humidifier or inhale steam from a shower to increase air moisture. This may make it easier to breathe.  Drink enough fluid to keep your pee (urine) clear or pale yellow.  Eat soups and other clear broths.  Have a healthy diet.  Rest as needed.  Go back to work when your fever is gone or your doctor says it is okay. ? You may need to stay home longer to avoid giving your URI to others. ? You can also wear a face mask and wash your hands often to prevent spread of the virus.  Use your inhaler more if you have asthma.  Do not use any tobacco products, including cigarettes, chewing tobacco, or electronic cigarettes. If you need help quitting, ask your doctor. Contact a doctor if:  You are getting worse, not better.  Your symptoms are not helped by medicine.  You have chills.  You are getting more short of breath.  You have brown or red mucus.  You have yellow or brown discharge from your nose.  You have pain in your face, especially when you bend forward.  You have a fever.  You have puffy (swollen) neck glands.  You have pain while swallowing.  You have white areas in the back of your throat. Get help right away if:  You have very bad or constant: ? Headache. ? Ear pain. ? Pain in your forehead, behind your eyes, and over your cheekbones (sinus pain). ? Chest pain.  You have long-lasting (chronic) lung disease and any of the following: ? Wheezing. ? Long-lasting cough. ? Coughing up blood. ? A change in your usual mucus.  You have a stiff neck.  You have  changes in your: ? Vision. ? Hearing. ? Thinking. ? Mood. This information is not intended to replace advice given to you by your health care provider. Make sure you discuss any questions you have with your health care provider. Document Released: 03/02/2008 Document Revised: 05/17/2016 Document Reviewed: 12/20/2013 Elsevier Interactive Patient Education  2018 Elsevier Inc.  

## 2017-10-22 ENCOUNTER — Inpatient Hospital Stay (HOSPITAL_COMMUNITY)
Admission: AD | Admit: 2017-10-22 | Discharge: 2017-10-22 | Disposition: A | Discharge status: Home / Self Care | Payer: BLUE CROSS/BLUE SHIELD | Source: Ambulatory Visit | Attending: Obstetrics and Gynecology | Admitting: Obstetrics and Gynecology

## 2017-10-22 ENCOUNTER — Encounter (HOSPITAL_COMMUNITY): Payer: Self-pay

## 2017-10-22 DIAGNOSIS — O98512 Other viral diseases complicating pregnancy, second trimester: Secondary | ICD-10-CM

## 2017-10-22 DIAGNOSIS — O99512 Diseases of the respiratory system complicating pregnancy, second trimester: Secondary | ICD-10-CM | POA: Diagnosis not present

## 2017-10-22 DIAGNOSIS — R05 Cough: Secondary | ICD-10-CM | POA: Diagnosis present

## 2017-10-22 DIAGNOSIS — Z882 Allergy status to sulfonamides status: Secondary | ICD-10-CM | POA: Diagnosis not present

## 2017-10-22 DIAGNOSIS — J069 Acute upper respiratory infection, unspecified: Secondary | ICD-10-CM | POA: Diagnosis not present

## 2017-10-22 DIAGNOSIS — Z3A20 20 weeks gestation of pregnancy: Secondary | ICD-10-CM | POA: Diagnosis not present

## 2017-10-22 DIAGNOSIS — Z885 Allergy status to narcotic agent status: Secondary | ICD-10-CM | POA: Diagnosis not present

## 2017-10-22 LAB — URINALYSIS, ROUTINE W REFLEX MICROSCOPIC
BACTERIA UA: NONE SEEN
BILIRUBIN URINE: NEGATIVE
Bacteria, UA: NONE SEEN
Bilirubin Urine: NEGATIVE
GLUCOSE, UA: NEGATIVE mg/dL
Glucose, UA: >=500 mg/dL — AB
HGB URINE DIPSTICK: NEGATIVE
Hgb urine dipstick: NEGATIVE
Ketones, ur: 80 mg/dL — AB
Ketones, ur: NEGATIVE mg/dL
LEUKOCYTES UA: NEGATIVE
LEUKOCYTES UA: NEGATIVE
NITRITE: NEGATIVE
Nitrite: NEGATIVE
Protein, ur: 30 mg/dL — AB
Protein, ur: NEGATIVE mg/dL
SPECIFIC GRAVITY, URINE: 1.023 (ref 1.005–1.030)
SPECIFIC GRAVITY, URINE: 1.012 (ref 1.005–1.030)
SQUAMOUS EPITHELIAL / LPF: NONE SEEN
pH: 6 (ref 5.0–8.0)
pH: 9 — ABNORMAL HIGH (ref 5.0–8.0)

## 2017-10-22 LAB — CBC
HEMATOCRIT: 31.6 % — AB (ref 36.0–46.0)
Hemoglobin: 10.7 g/dL — ABNORMAL LOW (ref 12.0–15.0)
MCH: 30.5 pg (ref 26.0–34.0)
MCHC: 33.9 g/dL (ref 30.0–36.0)
MCV: 90 fL (ref 78.0–100.0)
Platelets: 175 10*3/uL (ref 150–400)
RBC: 3.51 MIL/uL — ABNORMAL LOW (ref 3.87–5.11)
RDW: 13.9 % (ref 11.5–15.5)
WBC: 7.4 10*3/uL (ref 4.0–10.5)

## 2017-10-22 MED ORDER — GI COCKTAIL ~~LOC~~
30.0000 mL | Freq: Once | ORAL | Status: AC
  Administered 2017-10-22: 30 mL via ORAL
  Filled 2017-10-22: qty 30

## 2017-10-22 MED ORDER — ONDANSETRON HCL 4 MG PO TABS: 4.0000 mg | ORAL_TABLET | Freq: Four times a day (QID) | ORAL | 0 refills | Status: DC

## 2017-10-22 MED ORDER — BENZONATATE 100 MG PO CAPS
200.0000 mg | ORAL_CAPSULE | Freq: Once | ORAL | Status: AC
  Administered 2017-10-22: 200 mg via ORAL
  Filled 2017-10-22: qty 2

## 2017-10-22 MED ORDER — DEXTROSE IN LACTATED RINGERS 5 % IV SOLN
INTRAVENOUS | Status: AC
  Administered 2017-10-22: 14:00:00 via INTRAVENOUS

## 2017-10-22 MED ORDER — DEXTROSE IN LACTATED RINGERS 5 % IV SOLN
INTRAVENOUS | Status: DC
  Administered 2017-10-22: 13:00:00 via INTRAVENOUS

## 2017-10-22 MED ORDER — BENZONATATE 100 MG PO CAPS: 200.0000 mg | ORAL_CAPSULE | Freq: Three times a day (TID) | ORAL | 0 refills | Status: DC

## 2017-10-22 MED ORDER — ONDANSETRON HCL 4 MG/2ML IJ SOLN
4.0000 mg | Freq: Once | INTRAMUSCULAR | Status: AC
  Administered 2017-10-22: 4 mg via INTRAVENOUS
  Filled 2017-10-22: qty 2

## 2017-10-22 MED ORDER — DEXTROSE IN LACTATED RINGERS 5 % IV SOLN
INTRAVENOUS | Status: AC
  Administered 2017-10-22: 12:00:00 via INTRAVENOUS

## 2017-10-22 NOTE — MAU Provider Note (Signed)
History     Chief Complaint  Patient presents with  . Cough  . Emesis During Pregnancy  . Chest Pain  42 yo P2 MWF @ 20 plus week gestation presents with c/o  Cough, vomited x 1 today. No sputum production. No fever. Pt was seen yesterday at urgent care and flu test was neg there   OB History    Gravida Para Term Preterm AB Living   6 2 2   3 2    SAB TAB Ectopic Multiple Live Births   2 1     2       Past Medical History:  Diagnosis Date  . Anxiety   . Depression     Past Surgical History:  Procedure Laterality Date  . DILATION AND EVACUATION N/A 04/09/2015   Procedure: DILATATION AND EVACUATION with chromosome analysis;  Surgeon: Maxie BetterSheronette Teniqua Marron, MD;  Location: [facility];  Service: Gynecology;  Laterality: N/A;  . DILATION AND EVACUATION N/A 01/05/2016   Procedure: DILATATION AND EVACUATION;  Surgeon: Maxie BetterSheronette Haunani Dickard, MD;  Location: [facility];  Service: Gynecology;  Laterality: N/A;  . FOOT SURGERY    . LAPAROSCOPIC ABDOMINAL EXPLORATION    . OVARIAN CYST REMOVAL      History reviewed. No pertinent family history.  Social History   Tobacco Use  . Smoking status: Never Smoker  . Smokeless tobacco: Never Used  Substance Use Topics  . Alcohol use: No  . Drug use: No    Allergies:  Allergies  Allergen Reactions  . Adderall Xr [Amphetamine-Dextroamphet Er] Other (See Comments)    Causes depression and moodiness  . Percocet [Oxycodone-Acetaminophen] Itching, Palpitations and Rash    Pt states she can take Acetaminophen  . Sulfa Antibiotics Hives and Rash  . Vicodin [Hydrocodone-Acetaminophen] Palpitations and Rash    Medications Prior to Admission  Medication Sig Dispense Refill Last Dose  . acetaminophen (TYLENOL) 500 MG tablet Take 1,000 mg by mouth every 6 (six) hours as needed for mild pain, moderate pain or headache.   Past Week at Unknown time  . fluticasone (FLONASE) 50 MCG/ACT nasal spray One spray in each nostril twice a day, use left hand for right  nostril, and right hand for left nostril. 48 g 3 Past Week at Unknown time  . folic acid (FOLVITE) 1 MG tablet Take 4 tablets by mouth daily.  11 Past Week at Unknown time  . Prenat-FeFum-FePo-FA-Omega 3 (CONCEPT DHA) 53.5-38-1 MG CAPS Take 1 tablet by mouth daily.  12 Past Week at Unknown time     Physical Exam   Blood pressure 134/64, pulse (!) 131, temperature 99.2 F (37.3 C), resp. rate 18, weight 95.7 kg (211 lb), SpO2 100 %.  General appearance: alert, cooperative and no distress Lungs: clear to auscultation bilaterally Heart: tachycardia Abdomen: soft nontender gravid Pelvic: deferred Extremities: no edema, redness or tenderness in the calves or thighs ED Course  IMP: viral Upper respiratory infection in pregnancy IUP @ 20 plus weeks P) cbc, IVF. Tessalon perles, zofran iv, GI cocktail, urinalysis MDM  Addendum. 3 L IVF given. Pulse remains elevated but was not different from the day prior at her PCP. Given flu testing negative x 2.( one done at PCP yesterday) . Pt tolerated crackers and kept fluid ( oral down). Will d/c home. F/u in office on Friday. DIET reviewed during this time. Scripts sent [name], MD 1:42 PM 10/22/2017

## 2017-10-22 NOTE — Discharge Instructions (Signed)
Diet as tolerated. Increase oral fluid intake. Gradual increase solid intake

## 2017-10-22 NOTE — MAU Note (Addendum)
Pt had a cough that she thought she got from her daughter who was sick. Then she started having vomiting yesterday and chest pain and heartburn. Pt also has had a fever. No diarrhea.

## 2017-10-22 NOTE — MAU Note (Signed)
Pt went to her primary care yesterday and had flu swab and was negative.

## 2018-01-24 ENCOUNTER — Encounter (HOSPITAL_COMMUNITY): Payer: Self-pay

## 2018-01-24 ENCOUNTER — Other Ambulatory Visit: Payer: Self-pay | Admitting: Obstetrics and Gynecology

## 2018-02-02 NOTE — Patient Instructions (Signed)
Haley Church  02/02/2018   Your procedure is scheduled on:  02/04/2018  Enter through the Main Entrance of [facility] at 0945 AM.  Pick up the phone at the desk and dial [phone]  Call this number if you have problems the morning of [phone]  Remember:   Do not eat food:(After Midnight) Desps de medianoche.  Do not drink clear liquids: (After Midnight) Desps de medianoche.  Take these medicines the morning of surgery with A SIP OF WATER: none   Do not wear jewelry, make-up or nail polish.  Do not wear lotions, powders, or perfumes. Do not wear deodorant.  Do not shave 48 hours prior to surgery.  Do not bring valuables to the hospital.  [facility] is not   responsible for any belongings or valuables brought to the hospital.  Contacts, dentures or bridgework may not be worn into surgery.  Leave suitcase in the car. After surgery it may be brought to your room.  For patients admitted to the hospital, checkout time is 11:00 AM the day of              discharge.    N/A   Please read over the following fact sheets that you were given:   Surgical Site Infection Prevention

## 2018-02-03 ENCOUNTER — Encounter (HOSPITAL_COMMUNITY)
Admission: RE | Admit: 2018-02-03 | Discharge: 2018-02-03 | Disposition: A | Discharge status: Home / Self Care | Payer: BLUE CROSS/BLUE SHIELD | Source: Ambulatory Visit | Attending: Obstetrics and Gynecology | Admitting: Obstetrics and Gynecology

## 2018-02-03 HISTORY — DX: Supervision of elderly multigravida, unspecified trimester: O09.529

## 2018-02-03 HISTORY — DX: Genetic susceptibility to other disease: Z15.89

## 2018-02-03 HISTORY — DX: Methylenetetrahydrofolate reductase deficiency: E72.12

## 2018-02-03 LAB — CBC
HEMATOCRIT: 35.1 % — AB (ref 36.0–46.0)
HEMOGLOBIN: 11.5 g/dL — AB (ref 12.0–15.0)
MCH: 30.3 pg (ref 26.0–34.0)
MCHC: 32.8 g/dL (ref 30.0–36.0)
MCV: 92.6 fL (ref 78.0–100.0)
Platelets: 228 10*3/uL (ref 150–400)
RBC: 3.79 MIL/uL — AB (ref 3.87–5.11)
RDW: 14.3 % (ref 11.5–15.5)
WBC: 12.6 10*3/uL — AB (ref 4.0–10.5)

## 2018-02-03 LAB — TYPE AND SCREEN
ABO/RH(D): O POS
Antibody Screen: NEGATIVE

## 2018-02-04 ENCOUNTER — Encounter (HOSPITAL_COMMUNITY): Payer: Self-pay | Admitting: *Deleted

## 2018-02-04 ENCOUNTER — Encounter (HOSPITAL_COMMUNITY)
Admission: AD | Disposition: A | Discharge status: Home / Self Care | Payer: Self-pay | Source: Ambulatory Visit | Attending: Obstetrics and Gynecology

## 2018-02-04 ENCOUNTER — Inpatient Hospital Stay (HOSPITAL_COMMUNITY): Payer: BLUE CROSS/BLUE SHIELD | Admitting: Anesthesiology

## 2018-02-04 ENCOUNTER — Inpatient Hospital Stay (HOSPITAL_COMMUNITY)
Admission: AD | Admit: 2018-02-04 | Discharge: 2018-02-07 | DRG: 784 | Disposition: A | Discharge status: Home / Self Care | Payer: BLUE CROSS/BLUE SHIELD | Source: Ambulatory Visit | Attending: Obstetrics and Gynecology | Admitting: Obstetrics and Gynecology

## 2018-02-04 DIAGNOSIS — Z3A36 36 weeks gestation of pregnancy: Secondary | ICD-10-CM

## 2018-02-04 DIAGNOSIS — D62 Acute posthemorrhagic anemia: Secondary | ICD-10-CM | POA: Diagnosis not present

## 2018-02-04 DIAGNOSIS — Z8614 Personal history of Methicillin resistant Staphylococcus aureus infection: Secondary | ICD-10-CM | POA: Diagnosis not present

## 2018-02-04 DIAGNOSIS — A6000 Herpesviral infection of urogenital system, unspecified: Secondary | ICD-10-CM | POA: Diagnosis present

## 2018-02-04 DIAGNOSIS — O9902 Anemia complicating childbirth: Secondary | ICD-10-CM | POA: Diagnosis present

## 2018-02-04 DIAGNOSIS — O9081 Anemia of the puerperium: Secondary | ICD-10-CM | POA: Diagnosis not present

## 2018-02-04 DIAGNOSIS — O99824 Streptococcus B carrier state complicating childbirth: Secondary | ICD-10-CM | POA: Diagnosis present

## 2018-02-04 DIAGNOSIS — Z302 Encounter for sterilization: Secondary | ICD-10-CM

## 2018-02-04 DIAGNOSIS — O9832 Other infections with a predominantly sexual mode of transmission complicating childbirth: Secondary | ICD-10-CM | POA: Diagnosis present

## 2018-02-04 DIAGNOSIS — Z98891 History of uterine scar from previous surgery: Secondary | ICD-10-CM

## 2018-02-04 LAB — RPR: RPR: NONREACTIVE

## 2018-02-04 SURGERY — Surgical Case
Anesthesia: Spinal | Site: Abdomen | Wound class: Clean Contaminated

## 2018-02-04 MED ORDER — NALOXONE HCL 0.4 MG/ML IJ SOLN: 0.4000 mg | INTRAMUSCULAR | Status: DC | PRN

## 2018-02-04 MED ORDER — OXYTOCIN 10 UNIT/ML IJ SOLN
INTRAMUSCULAR | Status: AC
  Filled 2018-02-04: qty 4

## 2018-02-04 MED ORDER — LACTATED RINGERS IV SOLN
INTRAVENOUS | Status: DC
  Administered 2018-02-04 (×2): via INTRAVENOUS

## 2018-02-04 MED ORDER — HYDROMORPHONE HCL 2 MG PO TABS
4.0000 mg | ORAL_TABLET | ORAL | Status: DC | PRN
  Administered 2018-02-05 – 2018-02-07 (×7): 4 mg via ORAL
  Filled 2018-02-04 (×7): qty 2

## 2018-02-04 MED ORDER — OXYTOCIN 40 UNITS IN LACTATED RINGERS INFUSION - SIMPLE MED: 2.5000 [IU]/h | INTRAVENOUS | Status: AC

## 2018-02-04 MED ORDER — BUPIVACAINE IN DEXTROSE 0.75-8.25 % IT SOLN
INTRATHECAL | Status: DC | PRN
  Administered 2018-02-04: 1.4 mL via INTRATHECAL

## 2018-02-04 MED ORDER — SODIUM CHLORIDE 0.9% FLUSH: 3.0000 mL | INTRAVENOUS | Status: DC | PRN

## 2018-02-04 MED ORDER — METOCLOPRAMIDE HCL 5 MG/ML IJ SOLN: 10.0000 mg | Freq: Once | INTRAMUSCULAR | Status: DC | PRN

## 2018-02-04 MED ORDER — ONDANSETRON HCL 4 MG/2ML IJ SOLN: 4.0000 mg | Freq: Three times a day (TID) | INTRAMUSCULAR | Status: DC | PRN

## 2018-02-04 MED ORDER — COCONUT OIL OIL
1.0000 "application " | TOPICAL_OIL | Status: DC | PRN
  Administered 2018-02-05: 1 via TOPICAL
  Filled 2018-02-04: qty 120

## 2018-02-04 MED ORDER — SIMETHICONE 80 MG PO CHEW
80.0000 mg | CHEWABLE_TABLET | Freq: Three times a day (TID) | ORAL | Status: DC
  Administered 2018-02-04 – 2018-02-07 (×8): 80 mg via ORAL
  Filled 2018-02-04 (×7): qty 1

## 2018-02-04 MED ORDER — MEPERIDINE HCL 25 MG/ML IJ SOLN
INTRAMUSCULAR | Status: AC
  Filled 2018-02-04: qty 1

## 2018-02-04 MED ORDER — ZOLPIDEM TARTRATE 5 MG PO TABS: 5.0000 mg | ORAL_TABLET | Freq: Every evening | ORAL | Status: DC | PRN

## 2018-02-04 MED ORDER — DIPHENHYDRAMINE HCL 25 MG PO CAPS
25.0000 mg | ORAL_CAPSULE | ORAL | Status: DC | PRN
  Filled 2018-02-04: qty 1

## 2018-02-04 MED ORDER — NALBUPHINE HCL 10 MG/ML IJ SOLN
5.0000 mg | INTRAMUSCULAR | Status: DC | PRN
  Administered 2018-02-04 – 2018-02-05 (×3): 5 mg via INTRAVENOUS
  Filled 2018-02-04 (×2): qty 1

## 2018-02-04 MED ORDER — MENTHOL 3 MG MT LOZG: 1.0000 | LOZENGE | OROMUCOSAL | Status: DC | PRN

## 2018-02-04 MED ORDER — KETOROLAC TROMETHAMINE 30 MG/ML IJ SOLN: 30.0000 mg | Freq: Four times a day (QID) | INTRAMUSCULAR | Status: AC | PRN

## 2018-02-04 MED ORDER — DIPHENHYDRAMINE HCL 50 MG/ML IJ SOLN: 12.5000 mg | INTRAMUSCULAR | Status: DC | PRN

## 2018-02-04 MED ORDER — NALBUPHINE HCL 10 MG/ML IJ SOLN: 5.0000 mg | Freq: Once | INTRAMUSCULAR | Status: DC | PRN

## 2018-02-04 MED ORDER — SODIUM CHLORIDE 0.9 % IJ SOLN
INTRAMUSCULAR | Status: AC
  Filled 2018-02-04: qty 20

## 2018-02-04 MED ORDER — FENTANYL CITRATE (PF) 100 MCG/2ML IJ SOLN
INTRAMUSCULAR | Status: DC | PRN
  Administered 2018-02-04: 10 ug via INTRATHECAL

## 2018-02-04 MED ORDER — LACTATED RINGERS IV SOLN
INTRAVENOUS | Status: DC
  Administered 2018-02-04: 12:00:00 via INTRAVENOUS

## 2018-02-04 MED ORDER — DIPHENHYDRAMINE HCL 50 MG/ML IJ SOLN
INTRAMUSCULAR | Status: AC
  Filled 2018-02-04: qty 1

## 2018-02-04 MED ORDER — SODIUM CHLORIDE 0.9 % IR SOLN
Status: DC | PRN
  Administered 2018-02-04: 1

## 2018-02-04 MED ORDER — CEFAZOLIN SODIUM-DEXTROSE 2-4 GM/100ML-% IV SOLN
2.0000 g | INTRAVENOUS | Status: AC
  Administered 2018-02-04: 2 g via INTRAVENOUS
  Filled 2018-02-04: qty 100

## 2018-02-04 MED ORDER — IBUPROFEN 600 MG PO TABS
600.0000 mg | ORAL_TABLET | Freq: Four times a day (QID) | ORAL | Status: DC
  Administered 2018-02-05 – 2018-02-07 (×11): 600 mg via ORAL
  Filled 2018-02-04 (×10): qty 1

## 2018-02-04 MED ORDER — SCOPOLAMINE 1 MG/3DAYS TD PT72
1.0000 | MEDICATED_PATCH | Freq: Once | TRANSDERMAL | Status: DC
  Filled 2018-02-04: qty 1

## 2018-02-04 MED ORDER — LACTATED RINGERS IV SOLN
INTRAVENOUS | Status: DC
  Administered 2018-02-04 – 2018-02-05 (×2): via INTRAVENOUS

## 2018-02-04 MED ORDER — SOD CITRATE-CITRIC ACID 500-334 MG/5ML PO SOLN
30.0000 mL | Freq: Once | ORAL | Status: AC
  Administered 2018-02-04: 30 mL via ORAL
  Filled 2018-02-04: qty 15

## 2018-02-04 MED ORDER — WITCH HAZEL-GLYCERIN EX PADS: 1.0000 "application " | MEDICATED_PAD | CUTANEOUS | Status: DC | PRN

## 2018-02-04 MED ORDER — MEPERIDINE HCL 25 MG/ML IJ SOLN
INTRAMUSCULAR | Status: DC | PRN
  Administered 2018-02-04: 12.5 mg via INTRAVENOUS

## 2018-02-04 MED ORDER — PHENYLEPHRINE 8 MG IN D5W 100 ML (0.08MG/ML) PREMIX OPTIME
INJECTION | INTRAVENOUS | Status: AC
  Filled 2018-02-04: qty 100

## 2018-02-04 MED ORDER — SENNOSIDES-DOCUSATE SODIUM 8.6-50 MG PO TABS
2.0000 | ORAL_TABLET | ORAL | Status: DC
  Administered 2018-02-05 – 2018-02-07 (×3): 2 via ORAL
  Filled 2018-02-04 (×3): qty 2

## 2018-02-04 MED ORDER — ACETAMINOPHEN 500 MG PO TABS
1000.0000 mg | ORAL_TABLET | Freq: Four times a day (QID) | ORAL | Status: AC
  Administered 2018-02-04 – 2018-02-05 (×3): 1000 mg via ORAL
  Filled 2018-02-04 (×3): qty 2

## 2018-02-04 MED ORDER — FENTANYL CITRATE (PF) 100 MCG/2ML IJ SOLN
INTRAMUSCULAR | Status: AC
  Filled 2018-02-04: qty 2

## 2018-02-04 MED ORDER — NALOXONE HCL 4 MG/10ML IJ SOLN
1.0000 ug/kg/h | INTRAMUSCULAR | Status: DC | PRN
  Filled 2018-02-04: qty 5

## 2018-02-04 MED ORDER — DIPHENHYDRAMINE HCL 50 MG/ML IJ SOLN
INTRAMUSCULAR | Status: DC | PRN
  Administered 2018-02-04: 25 mg via INTRAVENOUS

## 2018-02-04 MED ORDER — BUPIVACAINE HCL (PF) 0.25 % IJ SOLN
INTRAMUSCULAR | Status: DC | PRN
  Administered 2018-02-04: 10 mL

## 2018-02-04 MED ORDER — BUPIVACAINE HCL (PF) 0.25 % IJ SOLN
INTRAMUSCULAR | Status: AC
  Filled 2018-02-04: qty 30

## 2018-02-04 MED ORDER — SIMETHICONE 80 MG PO CHEW: 80.0000 mg | CHEWABLE_TABLET | ORAL | Status: DC | PRN

## 2018-02-04 MED ORDER — LACTATED RINGERS IV SOLN: INTRAVENOUS | Status: DC

## 2018-02-04 MED ORDER — PHENYLEPHRINE 8 MG IN D5W 100 ML (0.08MG/ML) PREMIX OPTIME
INJECTION | INTRAVENOUS | Status: DC | PRN
  Administered 2018-02-04: 60 ug/min via INTRAVENOUS

## 2018-02-04 MED ORDER — KETOROLAC TROMETHAMINE 30 MG/ML IJ SOLN
INTRAMUSCULAR | Status: AC
  Filled 2018-02-04: qty 1

## 2018-02-04 MED ORDER — MORPHINE SULFATE (PF) 0.5 MG/ML IJ SOLN
INTRAMUSCULAR | Status: DC | PRN
  Administered 2018-02-04: .2 mg via INTRATHECAL

## 2018-02-04 MED ORDER — DIBUCAINE 1 % RE OINT: 1.0000 "application " | TOPICAL_OINTMENT | RECTAL | Status: DC | PRN

## 2018-02-04 MED ORDER — DIPHENHYDRAMINE HCL 25 MG PO CAPS: 25.0000 mg | ORAL_CAPSULE | Freq: Four times a day (QID) | ORAL | Status: DC | PRN

## 2018-02-04 MED ORDER — FENTANYL CITRATE (PF) 100 MCG/2ML IJ SOLN: 25.0000 ug | INTRAMUSCULAR | Status: DC | PRN

## 2018-02-04 MED ORDER — KETOROLAC TROMETHAMINE 30 MG/ML IJ SOLN
30.0000 mg | Freq: Four times a day (QID) | INTRAMUSCULAR | Status: AC | PRN
  Administered 2018-02-04: 30 mg via INTRAMUSCULAR

## 2018-02-04 MED ORDER — MEPERIDINE HCL 25 MG/ML IJ SOLN: 6.2500 mg | INTRAMUSCULAR | Status: DC | PRN

## 2018-02-04 MED ORDER — SCOPOLAMINE 1 MG/3DAYS TD PT72
1.0000 | MEDICATED_PATCH | Freq: Once | TRANSDERMAL | Status: DC
  Administered 2018-02-04: 1.5 mg via TRANSDERMAL
  Filled 2018-02-04: qty 1

## 2018-02-04 MED ORDER — NALBUPHINE HCL 10 MG/ML IJ SOLN: 5.0000 mg | INTRAMUSCULAR | Status: DC | PRN

## 2018-02-04 MED ORDER — SIMETHICONE 80 MG PO CHEW
80.0000 mg | CHEWABLE_TABLET | ORAL | Status: DC
  Administered 2018-02-05 (×2): 80 mg via ORAL
  Filled 2018-02-04 (×3): qty 1

## 2018-02-04 MED ORDER — ONDANSETRON HCL 4 MG/2ML IJ SOLN
INTRAMUSCULAR | Status: AC
  Filled 2018-02-04: qty 2

## 2018-02-04 MED ORDER — PHENYLEPHRINE HCL 10 MG/ML IJ SOLN
INTRAMUSCULAR | Status: DC | PRN
  Administered 2018-02-04 (×4): 80 ug via INTRAVENOUS

## 2018-02-04 MED ORDER — DEXAMETHASONE SODIUM PHOSPHATE 10 MG/ML IJ SOLN
INTRAMUSCULAR | Status: AC
  Filled 2018-02-04: qty 1

## 2018-02-04 MED ORDER — PRENATAL MULTIVITAMIN CH
1.0000 | ORAL_TABLET | Freq: Every day | ORAL | Status: DC
  Administered 2018-02-05 – 2018-02-07 (×3): 1 via ORAL
  Filled 2018-02-04 (×2): qty 1

## 2018-02-04 MED ORDER — MORPHINE SULFATE (PF) 0.5 MG/ML IJ SOLN
INTRAMUSCULAR | Status: AC
Start: 2018-02-04 — End: ?
  Filled 2018-02-04: qty 10

## 2018-02-04 MED ORDER — PHENYLEPHRINE 40 MCG/ML (10ML) SYRINGE FOR IV PUSH (FOR BLOOD PRESSURE SUPPORT)
PREFILLED_SYRINGE | INTRAVENOUS | Status: AC
  Filled 2018-02-04: qty 10

## 2018-02-04 SURGICAL SUPPLY — 45 items
BARRIER ADHS 3X4 INTERCEED (GAUZE/BANDAGES/DRESSINGS) ×8 IMPLANT
BENZOIN TINCTURE PRP APPL 2/3 (GAUZE/BANDAGES/DRESSINGS) ×4 IMPLANT
CHLORAPREP W/TINT 26ML (MISCELLANEOUS) ×4 IMPLANT
CLAMP CORD UMBIL (MISCELLANEOUS) IMPLANT
CLOSURE STERI STRIP 1/2 X4 (GAUZE/BANDAGES/DRESSINGS) ×4 IMPLANT
CLOSURE WOUND 1/2 X4 (GAUZE/BANDAGES/DRESSINGS)
CLOTH BEACON ORANGE TIMEOUT ST (SAFETY) ×4 IMPLANT
DRAPE C SECTION CLR SCREEN (DRAPES) ×4 IMPLANT
DRSG OPSITE POSTOP 4X10 (GAUZE/BANDAGES/DRESSINGS) ×4 IMPLANT
ELECT REM PT RETURN 9FT ADLT (ELECTROSURGICAL) ×4
ELECTRODE REM PT RTRN 9FT ADLT (ELECTROSURGICAL) ×2 IMPLANT
EXTRACTOR VACUUM M CUP 4 TUBE (SUCTIONS) IMPLANT
EXTRACTOR VACUUM M CUP 4' TUBE (SUCTIONS)
GLOVE BIOGEL PI IND STRL 7.0 (GLOVE) ×4 IMPLANT
GLOVE BIOGEL PI INDICATOR 7.0 (GLOVE) ×4
GLOVE ECLIPSE 6.5 STRL STRAW (GLOVE) ×4 IMPLANT
GOWN STRL REUS W/TWL LRG LVL3 (GOWN DISPOSABLE) ×8 IMPLANT
KIT ABG SYR 3ML LUER SLIP (SYRINGE) IMPLANT
NEEDLE HYPO 22GX1.5 SAFETY (NEEDLE) ×4 IMPLANT
NEEDLE HYPO 25X5/8 SAFETYGLIDE (NEEDLE) IMPLANT
NS IRRIG 1000ML POUR BTL (IV SOLUTION) ×4 IMPLANT
PACK C SECTION WH (CUSTOM PROCEDURE TRAY) ×4 IMPLANT
PAD OB MATERNITY 4.3X12.25 (PERSONAL CARE ITEMS) ×4 IMPLANT
RTRCTR C-SECT PINK 25CM LRG (MISCELLANEOUS) ×4 IMPLANT
STRIP CLOSURE SKIN 1/2X4 (GAUZE/BANDAGES/DRESSINGS) IMPLANT
SUT CHROMIC GUT AB #0 18 (SUTURE) IMPLANT
SUT MNCRL 0 VIOLET CTX 36 (SUTURE) ×6 IMPLANT
SUT MON AB 2-0 SH 27 (SUTURE)
SUT MON AB 2-0 SH27 (SUTURE) IMPLANT
SUT MON AB 3-0 SH 27 (SUTURE)
SUT MON AB 3-0 SH27 (SUTURE) IMPLANT
SUT MON AB 4-0 PS1 27 (SUTURE) IMPLANT
SUT MONOCRYL 0 CTX 36 (SUTURE) ×6
SUT PLAIN 2 0 (SUTURE)
SUT PLAIN 2 0 XLH (SUTURE) ×4 IMPLANT
SUT PLAIN ABS 2-0 CT1 27XMFL (SUTURE) IMPLANT
SUT VIC AB 0 CT1 36 (SUTURE) ×8 IMPLANT
SUT VIC AB 2-0 CT1 27 (SUTURE) ×2
SUT VIC AB 2-0 CT1 TAPERPNT 27 (SUTURE) ×2 IMPLANT
SUT VIC AB 4-0 KS 27 (SUTURE) ×4 IMPLANT
SUT VIC AB 4-0 PS2 27 (SUTURE) IMPLANT
SYR CONTROL 10ML LL (SYRINGE) ×4 IMPLANT
TOWEL OR 17X24 6PK STRL BLUE (TOWEL DISPOSABLE) ×4 IMPLANT
TRAY FOLEY W/BAG SLVR 14FR LF (SET/KITS/TRAYS/PACK) IMPLANT
YANKAUER SUCT BULB TIP NO VENT (SUCTIONS) ×4 IMPLANT

## 2018-02-04 NOTE — Brief Op Note (Signed)
02/04/2018  1:26 PM  PATIENT:  Haley Church  42 y.o. female  PRE-OPERATIVE DIAGNOSIS:  Vasa Previa, desired sterilization . iup @ 36 4/7 WEEKS  POST-OPERATIVE DIAGNOSIS:  Vasa previa, desired sterilization, iup @ 36 4/7 WEEKS  PROCEDURE:  Primary Cesarean section, kerr hysterotomy, right partial salpingectomy  SURGEON:  Surgeon(s) and Role:    * Maxie Better, MD - Primary     PHYSICIAN ASSISTANT:   ASSISTANTS: Rhoderick Moody, MD  ANESTHESIA:   spinal FINDINGS; live female. CANx 2, placenta previa with succenturiate lobe, surgical absent left tube, nl right tube, nl ovaries EBL:  842 mL   BLOOD ADMINISTERED:none  DRAINS: none   LOCAL MEDICATIONS USED:  MARCAINE     SPECIMEN:  Source of Specimen:  portion of right tube  DISPOSITION OF SPECIMEN:  PATHOLOGY  COUNTS:  YES  TOURNIQUET:  * No tourniquets in log *  DICTATION: .Other Dictation: Dictation Number [id number]  PLAN OF CARE: Admit to inpatient   PATIENT DISPOSITION:  PACU - hemodynamically stable.   Delay start of Pharmacological VTE agent (>24hrs) due to surgical blood loss or risk of bleeding: no

## 2018-02-04 NOTE — Anesthesia Postprocedure Evaluation (Signed)
Anesthesia Post Note  Patient: Fish farm manager  Procedure(s) Performed: CESAREAN SECTION WITH BILATERAL TUBAL LIGATION (N/A Abdomen)     Patient location during evaluation: Mother Baby Anesthesia Type: Spinal Level of consciousness: awake and alert Pain management: pain level controlled Vital Signs Assessment: post-procedure vital signs reviewed and stable Respiratory status: spontaneous breathing Cardiovascular status: blood pressure returned to baseline Postop Assessment: no headache, no backache, spinal receding, patient able to bend at knees, no apparent nausea or vomiting and adequate PO intake Anesthetic complications: no    Last Vitals:  Vitals:   02/04/18 1416 02/04/18 1426  BP: (!) 146/94 (!) 144/93  Pulse: 99 97  Resp: (!) 23 16  Temp: 37.2 C (!) 36.3 C  SpO2: 100% 100%    Last Pain:  Vitals:   02/04/18 1432  TempSrc:   PainSc: 0-No pain   Pain Goal:                 Haley Church

## 2018-02-04 NOTE — H&P (Addendum)
Haley Church is a 42 y.o. female presenting at 84 4/[redacted] weeks gestation for primary C/S, TL due to vasa previa and desired sterilization. BMZ complete. 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  OB History    Gravida  9   Para  2   Term  2   Preterm      AB  4   Living  2     SAB  4   TAB  2   Ectopic      Multiple      Live Births  2          Past Medical History:  Diagnosis Date  . AMA (advanced maternal age) multigravida 35+   . Anxiety   . Depression   . MTHFR mutation [facility])    Past Surgical History:  Procedure Laterality Date  . DILATION AND EVACUATION N/A 04/09/2015   Procedure: DILATATION AND EVACUATION with chromosome analysis;  Surgeon: Maxie Better, MD;  Location: [facility];  Service: Gynecology;  Laterality: N/A;  . DILATION AND EVACUATION N/A 01/05/2016   Procedure: DILATATION AND EVACUATION;  Surgeon: Maxie Better, MD;  Location: [facility];  Service: Gynecology;  Laterality: N/A;  . FOOT SURGERY    . LAPAROSCOPIC ABDOMINAL EXPLORATION    . OVARIAN CYST REMOVAL     Family History: family history is not on file. Social History:  reports that she has never smoked. She has never used smokeless tobacco. She reports that she does not drink alcohol or use drugs.     Maternal Diabetes: No Genetic Screening: Normal Maternal Ultrasounds/Referrals: Normal Fetal Ultrasounds or other Referrals:  None Maternal Substance Abuse:  No Significant Maternal Medications:  Meds include: Other:  valtrex Significant Maternal Lab Results:  Lab values include: Group B Strep positive Other Comments:  AMA. BMZ complete 4/30, 5/1.   Review of Systems  All other systems reviewed and are negative.  Maternal Medical History:  Fetal activity: Perceived fetal activity is normal.    Prenatal complications: Vasa previa  Prenatal Complications - Diabetes: none.      There were no vitals taken for this visit. Maternal Exam:  Abdomen:  Patient reports no abdominal tenderness. Fetal presentation: vertex  Introitus: Normal vulva. Ferning test: not done.      Physical Exam  Constitutional: She is oriented to person, place, and time. She appears well-developed and well-nourished.  HENT:  Head: Atraumatic.  Eyes: EOM are normal.  Neck: Neck supple.  Cardiovascular: Regular rhythm.  GI: Soft.  Genitourinary:  Genitourinary Comments: deferred  Musculoskeletal: She exhibits no edema.  Neurological: She is alert and oriented to person, place, and time.  Skin: Skin is warm and dry.  Psychiatric: She has a normal mood and affect.    Prenatal labs: ABO, Rh: --/--/O POS (05/09 1025) Antibody: NEG (05/09 1025) Rubella: Immune (10/30 0000) RPR: Nonreactive (10/30 0000)  HBsAg: Negative (10/30 0000)  HIV: Non-reactive (10/30 0000)  GBS: Positive (10/30 0000)   Assessment/Plan: Vasa previa Desires sterilization AMA>35 GBS xz positive IUP @ 36 4/7week P)Primary C/S, TL. Risk of surgery reviewed including infection, bleeding, poss need for blood transfusion and its risk( HIV, hepatitis, acute rxn), internal scar tissue, injury to surrounding organ structure, permanent sterilization, nonreversible, failure rate 1/300. All ? answered   Ruthie Berch A Blake Vetrano 02/04/2018, 2:20 AM  Addendum I have reexamined pt no change since last visit. Confirmed BTL

## 2018-02-04 NOTE — Anesthesia Preprocedure Evaluation (Signed)
Anesthesia Evaluation  Patient identified by MRN, date of birth, ID band Patient awake    Reviewed: Allergy & Precautions, NPO status , Patient's Chart, lab work & pertinent test results  Airway Mallampati: II  TM Distance: >3 FB Neck ROM: Full    Dental no notable dental hx.    Pulmonary neg pulmonary ROS,    Pulmonary exam normal breath sounds clear to auscultation       Cardiovascular negative cardio ROS Normal cardiovascular exam Rhythm:Regular Rate:Normal     Neuro/Psych negative neurological ROS  negative psych ROS   GI/Hepatic negative GI ROS, Neg liver ROS,   Endo/Other  negative endocrine ROS  Renal/GU negative Renal ROS  negative genitourinary   Musculoskeletal negative musculoskeletal ROS (+)   Abdominal   Peds negative pediatric ROS (+)  Hematology negative hematology ROS (+)   Anesthesia Other Findings   Reproductive/Obstetrics negative OB ROS                             Anesthesia Physical Anesthesia Plan  ASA: II  Anesthesia Plan: Spinal   Post-op Pain Management:    Induction:   PONV Risk Score and Plan: 2 and Ondansetron, Treatment may vary due to age or medical condition and Scopolamine patch - Pre-op  Airway Management Planned: Natural Airway  Additional Equipment:   Intra-op Plan:   Post-operative Plan:   Informed Consent: I have reviewed the patients History and Physical, chart, labs and discussed the procedure including the risks, benefits and alternatives for the proposed anesthesia with the patient or authorized representative who has indicated his/her understanding and acceptance.   Dental advisory given  Plan Discussed with:   Anesthesia Plan Comments:         Anesthesia Quick Evaluation

## 2018-02-04 NOTE — Transfer of Care (Signed)
Immediate Anesthesia Transfer of Care Note  Patient: Nelida Meuse  Procedure(s) Performed: CESAREAN SECTION WITH BILATERAL TUBAL LIGATION (N/A Abdomen)  Patient Location: PACU  Anesthesia Type:Spinal  Level of Consciousness: awake, alert  and oriented  Airway & Oxygen Therapy: Patient Spontanous Breathing  Post-op Assessment: Report given to RN and Post -op Vital signs reviewed and stable  Post vital signs: Reviewed and stable  Last Vitals:  Vitals Value Taken Time  BP    Temp    Pulse 107 02/04/2018  1:18 PM  Resp    SpO2 100 % 02/04/2018  1:18 PM  Vitals shown include unvalidated device data.  Last Pain:  Vitals:   02/04/18 1004  TempSrc: Oral         Complications: No apparent anesthesia complications

## 2018-02-04 NOTE — Anesthesia Procedure Notes (Signed)
Spinal  Patient location during procedure: OR Staffing Anesthesiologist: Montez Hageman, MD Performed: anesthesiologist  Preanesthetic Checklist Completed: patient identified, site marked, surgical consent, pre-op evaluation, timeout performed, IV checked, risks and benefits discussed and monitors and equipment checked Spinal Block Patient position: sitting Prep: DuraPrep Patient monitoring: heart rate, continuous pulse ox and blood pressure Approach: right paramedian Location: L4-5 Injection technique: single-shot Needle Needle type: Sprotte  Needle gauge: 24 G Needle length: 9 cm Additional Notes Expiration date of kit checked and confirmed. Patient tolerated procedure well, without complications.

## 2018-02-05 DIAGNOSIS — O9902 Anemia complicating childbirth: Secondary | ICD-10-CM | POA: Diagnosis present

## 2018-02-05 DIAGNOSIS — Z98891 History of uterine scar from previous surgery: Secondary | ICD-10-CM

## 2018-02-05 LAB — CBC
HEMATOCRIT: 29 % — AB (ref 36.0–46.0)
HEMOGLOBIN: 9.5 g/dL — AB (ref 12.0–15.0)
MCH: 30.4 pg (ref 26.0–34.0)
MCHC: 32.8 g/dL (ref 30.0–36.0)
MCV: 92.9 fL (ref 78.0–100.0)
Platelets: 216 10*3/uL (ref 150–400)
RBC: 3.12 MIL/uL — ABNORMAL LOW (ref 3.87–5.11)
RDW: 14.2 % (ref 11.5–15.5)
WBC: 19.4 10*3/uL — AB (ref 4.0–10.5)

## 2018-02-05 NOTE — Progress Notes (Signed)
Mother of baby was referred for history of depression and anxiety. Referral screened out by CSW because per chart review, patient's diagnosis came from a high stress situation and was greater than three years ago in early 2016. Patient is currently managing with  Welbutrin and Klonapin for her diagnoses. Patient received appropriate prenatal care.  Please contact CSW if mother of baby requests, if needs arise, or if mother of baby scores greater than a nine or answers yes to question ten on Edinburgh Postpartum Depression Screen.   Corynn Solberg, MSW, LCSW-A Clinical Social Worker [facility] [phone]      

## 2018-02-05 NOTE — Op Note (Signed)
[name], Haley Church MEDICAL RECORD [id number] ACCOUNT [id number] DATE OF BIRTH:1976/05/10 FACILITY: [facility] LOCATION: [id number] PHYSICIAN:Ashunti Schofield A. Johnna Bollier, MD  OPERATIVE REPORT  DATE OF PROCEDURE:  02/04/2018  PREOPERATIVE DIAGNOSIS:  Vasa previa, desires sterilization.  PROCEDURE:  Primary cesarean section, Kerr hysterotomy, right partial salpingectomy.  POSTOPERATIVE DIAGNOSIS:  Vasa previa desires sterilization.  ANESTHESIA:  Spinal.  SURGEON:  Maxie Better, MD  ASSISTANT:  Rhoderick Moody, MD   DESCRIPTION OF PROCEDURE:  Under adequate spinal anesthesia, the patient was placed in the supine position.  She was sterilely prepped and draped in the usual fashion.  An indwelling Foley catheter was sterilely placed.  Marcaine 0.25% was injected along  the planned Pfannenstiel skin incision site.  Pfannenstiel skin incision was then made, carried down to the rectus fascia.  The rectus fascia was opened transversely.  Rectus fascia was then bluntly and sharply dissected off the rectus muscle in a superior and inferior fashion.  The rectus muscle was split in the midline.  The peritoneum was opened and extended.  A self-retaining Alexis retractor was then placed.  The vesicouterine peritoneum was opened transversely.  A transverse incision was  then made and extended bluntly.  Artificial rupture of membranes occurred, clear amniotic fluid.  Subsequent delivery of a live female with nuchal cord x2 which were reducible and the one loop on the arm was accomplished.  The baby was bulb suctioned at the  abdomen.  Delayed cord clamping occurred for 1 minute.  The placenta was then manually removed.  The cord was clamped and cut.  The baby was transferred to the awaiting pediatricians with Apgars of 9 and 9 at one and five minutes.  The uterine cavity was cleaned of debris.  The uterus was exteriorized.  The left tube was surgically absent.  The right tube was normal.  Both ovaries were normal.   The incision was closed in 2 layers using 0 Monocryl stitch.  The first layer was a running locked stitch.   Second layer was imbricated with 0 Monocryl suture.  The uterus was returned back to the abdomen.  The belly was irrigated, suctioned of debris.  Attention was then turned to right tube.  The midportion of the right tube was grasped with a Babcock.  The  underlying mesosalpinx was opened with cautery.  The proximal and distal portion was tied with 0Chromic x2, and the intervening segments were removed.  Once this was done, good hemostasis was achieved.  The Alexis retractor was removed.  Interceed was  placed in the lower uterine segment.  The parietal peritoneum was closed with 2-0 Vicryl.  The rectus fascia was closed with 0 Vicryl x2.  The subcutaneous area was irrigated, small bleeders cauterized.  Interrupted 2-0 plain sutures were placed, and the skin approximated with 4-0 Vicryl subcuticular closure.  SPECIMEN:  Portion of right tube sent to pathology.  ESTIMATED BLOOD LOSS:  842 ml .  INTRAOPERATIVE FLUIDS:  1600 mL.  URINE OUTPUT:  100 mL.  COUNTS:  Sponge and instrument counts x2 were correct.  COMPLICATIONS:  None.  DISPOSITION:  The patient tolerated the procedure well and was transferred to recovery room in stable condition.  LN/NUANCE  D:02/05/2018 T:02/05/2018 JOB:[id number]

## 2018-02-05 NOTE — Lactation Note (Signed)
This note was copied from a baby's chart. Lactation Consultation Note  Patient Name: Haley Church VHQIO'N Date: 02/05/2018 Reason for consult: Initial assessment;Late-preterm 25-36.6wks  49 hours old late-preterm female who is being exclusively BF by his mother, she's a P3 and somewhat experienced BF, she was able to BF her other kids for 1 month. Baby was asleep when entering the room, offered assistance with latch but mom politely declined stating baby already fed. Mom has already started pumping and she was able to get 10 ml of colostrum in her last pumping session, RN instructed parents how to syringe feed with a gloved finger, reviewed tips with parents.  Per MD baby has a slightly high palate but it doesn't seem to interfere with BF at this point, per mom feedings at the breast are comfortable, baby is latching on well, and she's able to hear swallows. She does have LATCH scores of 10 and 7 though, asked mom to call for latch assistance the next time baby is ready to feed.  Encouraged mom to feed baby STS 8-12 times/24 hours or sooner if feeding cues are present.  If baby is not cueing in a 3 hour period, she'll place him STS at the breast to give him an opportunity to feed. Mom will also pump after feedings and at least once at night and will syringe feed baby her EBM. Reviewed BF brochure, BF resources and feeding diary, mom is aware of LC services and will call PRN.  Maternal Data Formula Feeding for Exclusion: No Has patient been taught Hand Expression?: Yes Does the patient have breastfeeding experience prior to this delivery?: Yes  Feeding Feeding Type: Breast Milk  LATCH Score Latch: Repeated attempts needed to sustain latch, nipple held in mouth throughout feeding, stimulation needed to elicit sucking reflex.  Audible Swallowing: None  Type of Nipple: Everted at rest and after stimulation  Comfort (Breast/Nipple): Soft / non-tender  Hold (Positioning): No assistance needed  to correctly position infant at breast.  LATCH Score: 7  Interventions Interventions: Breast feeding basics reviewed;DEBP;Expressed milk  Lactation Tools Discussed/Used Tools: Pump Breast pump type: Double-Electric Breast Pump WIC Program: No Pump Review: Setup, frequency, and cleaning;Milk Storage Initiated by:: RN Date initiated:: 02/04/18   Consult Status Consult Status: Follow-up Date: 02/05/18 Follow-up type: In-patient    Devlin Brink Venetia Constable 02/05/2018, 12:30 AM

## 2018-02-05 NOTE — Progress Notes (Signed)
Subjective: POD# 1 Information for the patient's newborn:  Ivalene, Platte [[id number]]  female    circ planned Baby name: Horton Chin  Reports feeling well. Feeding: breast Patient reports tolerating PO.  Breast symptoms: none Pain controlled with PO meds Denies HA/SOB/C/P/N/V/dizziness. Flatus absent. She reports vaginal bleeding as normal, without clots.  She is ambulating, urinating without difficulty.     Objective:   VS:    Vitals:   02/04/18 2119 02/05/18 0028 02/05/18 0455 02/05/18 0750  BP:  133/70 116/77 131/71  Pulse:  94 91 86  Resp:  Temp:  98.1 F (36.7 C) 98.2 F (36.8 C) 98.9 F (37.2 C)  TempSrc:  Oral Oral   SpO2: 97% 98% 96% 100%  Weight:      Height:          Intake/Output Summary (Last 24 hours) at 02/05/2018 1110 Last data filed at 02/05/2018 0550 Gross per 24 hour  Intake 2320 ml  Output 2317 ml  Net 3 ml        Recent Labs    02/03/18 1025 02/05/18 0537  WBC 12.6* 19.4*  HGB 11.5* 9.5*  HCT 35.1* 29.0*  PLT 228 216     Blood type: --/--/O POS (05/09 1025)  Rubella: Immune (10/30 0000)     Physical Exam:  General: alert, cooperative and no distress CV: Regular rate and rhythm Resp: clear Abdomen: soft, nontender, decreased bowel sounds Incision: clean, dry and intact Uterine Fundus: firm, below umbilicus, nontender Lochia: minimal Ext: no edema, redness or tenderness in the calves or thighs      Assessment/Plan: 42 y.o.   POD# 1. [id number]                  Principal Problem:   S/P cesarean section - indication: Vasa Previa 5/10 Active Problems:   Postpartum care following cesarean delivery   Maternal anemia, with delivery  - started oral Fe and Mag Ox  Doing well, stable.               Advance diet as tolerated Encourage rest when baby rests Breastfeeding support Encourage to ambulate, warm fluids for gut motility Routine post-op care  Neta Mends, CNM, MSN 02/05/2018, 11:10 AM

## 2018-02-05 NOTE — Lactation Note (Signed)
This note was copied from a baby's chart. Lactation Consultation Note:  Mother reports that infant is sleepy and will only suckle a few sucks at the breast. Reviewed LPI parent teaching sheet. Discussed normal behaviors for LPI infants. Mother is able to hand express colostrum. Advised to post pump and supplement infant with any amts of ebm and then add formula to equal advised amt for supplementing every 2-3 hours. Staff nurse has assist mother with latching, supplementing infant and post pumping. Advised to continue with cue base feeding and frequent skin to skin. Informed mother that infant may begin to cluster feed during the night.  Discussed importance of supplementing LPI . Advised mother to do suck training with her finger. Mother is reluctant to offer formula, but will if needed.  Encouraged to post pump every 2-3 hours for 15-20 mins. Mother to page for assistance with next feeding.  Advised mother to hand express before and after each feeding. Mother very sleepy, but father at the bedside and is supportive. Mother receptive to all teaching.   Patient Name: Haley Church [id number] Date: 02/05/2018 Reason for consult: Follow-up assessment   Maternal Data    Feeding Feeding Type: Breast Fed Length of feed: 5 min  LATCH Score                   Interventions    Lactation Tools Discussed/Used     Consult Status Consult Status: Follow-up Date: 02/05/18 Follow-up type: In-patient    Stevan Born [facility] 02/05/2018, 4:37 PM

## 2018-02-06 ENCOUNTER — Other Ambulatory Visit: Payer: Self-pay

## 2018-02-06 ENCOUNTER — Encounter (HOSPITAL_COMMUNITY): Payer: Self-pay | Admitting: *Deleted

## 2018-02-06 MED ORDER — MAGNESIUM OXIDE 400 (241.3 MG) MG PO TABS
400.0000 mg | ORAL_TABLET | Freq: Every day | ORAL | Status: DC
  Administered 2018-02-06 – 2018-02-07 (×2): 400 mg via ORAL
  Filled 2018-02-06 (×3): qty 1

## 2018-02-06 MED ORDER — TETANUS-DIPHTH-ACELL PERTUSSIS 5-2.5-18.5 LF-MCG/0.5 IM SUSP
0.5000 mL | Freq: Once | INTRAMUSCULAR | Status: AC
  Administered 2018-02-06: 0.5 mL via INTRAMUSCULAR

## 2018-02-06 MED ORDER — POLYSACCHARIDE IRON COMPLEX 150 MG PO CAPS
150.0000 mg | ORAL_CAPSULE | Freq: Every day | ORAL | Status: DC
  Administered 2018-02-06 – 2018-02-07 (×2): 150 mg via ORAL
  Filled 2018-02-06 (×2): qty 1

## 2018-02-06 NOTE — Progress Notes (Signed)
Subjective: POD# 2 Information for the patient's newborn:  Haley, Church [[id number]]  female   Name Haley Church  Circumcision Completed 02/06/18  Reports feeling Good Feeding: breast and formula supplementing via SNS Patient reports tolerating PO and denies N/V.  Breast symptoms: Denies Pain controlled with PO meds Denies HA/SOB/dizziness.  Flatus Present Vaginal bleeding is normal, w/o clots. Ambulating and urinating w/o difficulty     Objective:  VS:    Vitals:   02/05/18 0750 02/05/18 1155 02/05/18 1744 02/06/18 0643  BP: 131/71 120/75 131/82 131/86  Pulse: 86 89 (!) 102 99  Resp: Temp: 98.9 F (37.2 C) 98.6 F (37 C) 98.4 F (36.9 C) 98.7 F (37.1 C)  TempSrc:  Oral Oral Oral  SpO2: 100% 98% 99%   Weight:      Height:          Intake/Output Summary (Last 24 hours) at 02/06/2018 0850 Last data filed at 02/05/2018 1245 Gross per 24 hour  Intake -  Output 650 ml  Net -650 ml       Recent Labs    02/03/18 1025 02/05/18 0537  WBC 12.6* 19.4*  HGB 11.5* 9.5*  HCT 35.1* 29.0*  PLT 228 216    Blood type: --/--/O POS (05/09 1025) Rubella: Immune (10/30 0000)    Physical Exam:  General: alert, cooperative and no distress Abdomen: soft, nontender, normal bowel sounds Incision: clean, dry, intact, Honeycomb dressing Uterine Fundus: firm, below umbilicus, nontender Lochia: minimal Ext: Trace edema to BLE, no calf tenderness, or cords   Assessment/Plan: 42 y.o.   POD# 2. [id number]  Assessment:             S/P C/S for Vasa Previa Anemia-acute blood loss after delivery Breastfeeding-At risk for ineffective breastfeeding    -Late preterm infant   Plan: Routine post-op PP care  Continue Niferex and Mag oxide         Encourage rest when baby rests Continued breastfeeding support from RN and LC   Roma Schanz, SNM 02/06/2018, 8:50 AM

## 2018-02-07 ENCOUNTER — Encounter (HOSPITAL_COMMUNITY): Payer: Self-pay | Admitting: Obstetrics and Gynecology

## 2018-02-07 MED ORDER — HYDROMORPHONE HCL 4 MG PO TABS: 4.0000 mg | ORAL_TABLET | ORAL | 0 refills | Status: DC | PRN

## 2018-02-07 MED ORDER — IBUPROFEN 600 MG PO TABS: 600.0000 mg | ORAL_TABLET | Freq: Four times a day (QID) | ORAL | 0 refills | Status: DC

## 2018-02-07 MED ORDER — FERROUS SULFATE 325 (65 FE) MG PO TABS: 325.0000 mg | ORAL_TABLET | Freq: Every day | ORAL | 3 refills | Status: DC

## 2018-02-07 NOTE — Discharge Summary (Addendum)
Obstetric Discharge Summary   Patient Name: Haley Church DOB: 01/17/1976 MRN: [id number]  Date of Admission: 02/04/2018 Date of Discharge: 02/07/2018 Date of Delivery: 02/04/2018 Gestational Age at Delivery: [redacted]w[redacted]d  Primary OB: [facility] - Dr. 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 Fritzi Mandes. [place] assist Delivery Type: primary cesarean section, low transverse incision  Newborn Data: Live born female  Birth Weight: 6 lb 13.5 oz (3105 g) APGAR: 9, 9  Newborn Delivery   Birth date/time:  02/04/2018 12:23:00 Delivery type:  C-Section, Low Transverse Trial of labor:  No C-section categorization:  Primary      Hospital/Postpartum Course  (Cesarean Section):  Pt. Admitted for primary LTCS for vasa previa and desired sterilization. Patient had an uncomplicated postpartum course.  By time of discharge on POD#3, her pain was controlled on oral pain medications; she had appropriate lochia and was ambulating, voiding without difficulty, tolerating regular diet and passing flatus.   She was deemed stable for discharge to home.     Labs: CBC Latest Ref Rng & Units 02/05/2018 02/03/2018 10/22/2017  WBC 4.0 - 10.5 K/uL 19.4(H) 12.6(H) 7.4  Hemoglobin 12.0 - 15.0 g/dL 4.0(J) 11.5(L) 10.7(L)  Hematocrit 36.0 - 46.0 % 29.0(L) 35.1(L) 31.6(L)  Platelets 150 - 400 K/uL 216 228 175   O POS  Physical exam:  BP 129/70 (BP Location: Left Arm)    Pulse 89   Temp 97.7 F (36.5 C) (Oral)   Resp 20   Ht  (1.753 m)   Wt 100.1 kg (220 lb 9.6 oz)   SpO2 97%   Breastfeeding? Unknown   BMI 32.58 kg/m  General: alert and no distress Pulm: normal respiratory effort Lochia: appropriate Abdomen: soft, NT Uterine Fundus: firm, below umbilicus Perineum: healing well, no significant erythema, no significant edema Incision: c/d/i, healing well, no significant drainage, no dehiscence, no significant erythema Extremities: No evidence of DVT seen on physical exam. +1 extremity edema.   Disposition: stable, discharge to home Baby Feeding: breast milk and formula Baby Disposition: home with mom  Contraception: s/p salpingectomy   Rh Immune globulin given: N/A Rubella vaccine given: N/A Tdap vaccine given in AP or PP setting: declined Flu vaccine given in AP or PP setting: not on file   Plan:  Kateland Uplinger was discharged to home in good condition. Follow-up appointment at [facility] in 6 weeks.  Discharge Instructions: Per After Visit Summary. Refer to After Visit Summary and [facility] discharge booklet  Activity: Advance as tolerated. Pelvic rest for 6 weeks.   Diet: Regular, Heart Healthy Discharge Medications: Allergies as of 02/07/2018      Reactions   Adderall Xr [amphetamine-dextroamphet Er] Other (See Comments)   Causes depression and moodiness   Percocet [oxycodone-acetaminophen] Itching, Palpitations, Rash   Pt states she can take Acetaminophen   Sulfa Antibiotics Hives, Rash   Vicodin [hydrocodone-acetaminophen] Palpitations, Rash      Medication List    STOP taking these medications  B-6 PO   diphenhydramine-acetaminophen 25-500 MG Tabs tablet Commonly known as:  TYLENOL PM     TAKE these medications   CONCEPT DHA 53.5-38-1 MG Caps Take 1 tablet by mouth daily.   ferrous sulfate 325 (65 FE) MG tablet Take 1 tablet (325 mg total) by mouth daily.   folic acid 1 MG tablet Commonly known as:   FOLVITE Take 4 mg by mouth daily.   HYDROmorphone 4 MG tablet Commonly known as:  DILAUDID Take 1 tablet (4 mg total) by mouth every 4 (four) hours as needed for severe pain.   ibuprofen 600 MG tablet Commonly known as:  ADVIL,MOTRIN Take 1 tablet (600 mg total) by mouth every 6 (six) hours.      Outpatient follow up:  Follow-up Information    Maxie Better, MD. Schedule an appointment as soon as possible for a visit in 6 week(s).   Specialty:  Obstetrics and Gynecology Why:  Posptartum visit  Contact information: [address] [place] [address] [phone]           Signed:  Carlean Jews, MSN, CNM [facility]

## 2018-02-07 NOTE — Lactation Note (Signed)
This note was copied from a baby's chart. Lactation Consultation Note  Patient Name: Haley Church [id number] Date: 02/07/2018   Second LC Attempt for Follow Up Visit:  This is the second time I am attempting to see this mother.  She and baby are sleeping.  Spoke to RN and she feels like mother has been well educated and informed and does not seem to need any assistance at this time.  She has been seen by 2 previous lactation consultants and helped.  Please call if assistance is needed.                  Clorissa Gruenberg R Arella Blinder 02/07/2018, 6:50 AM

## 2018-02-07 NOTE — Progress Notes (Signed)
POSTOPERATIVE DAY # 3 S/P [facility] for Vasa Previa with right partial salpingectomy, baby boy "Haley Church"   S:         Reports feeling good, ready to be discharged home; reports pain is much improved by taking Dilaudid every 4 hours              Tolerating po intake / no nausea / no vomiting / + flatus / no BM  Denies dizziness, SOB, or CP             Bleeding is moderate             Pain controlled withDilaudid, Motrin             Up ad lib / ambulatory/ voiding QS  Newborn breast and formula feeding - reports latching is going well / Circumcision - completed 02/06/18   O:  VS: BP 129/70 (BP Location: Left Arm)   Pulse 89   Temp 97.7 F (36.5 C) (Oral)   Resp 20   Ht  (1.753 m)   Wt 100.1 kg (220 lb 9.6 oz)   SpO2 97%   Breastfeeding? Unknown   BMI 32.58 kg/m    LABS:               Recent Labs    02/05/18 0537  WBC 19.4*  HGB 9.5*  PLT 216               Bloodtype: --/--/O POS (05/09 1025)  Rubella: Immune (10/30 0000)                                                       Physical Exam:             Alert and Oriented X3  Lungs: Clear and unlabored  Heart: regular rate and rhythm / no murmurs  Abdomen: soft, non-tender, non-distended, active bowel sounds in all quadrants              Fundus: firm, non-tender, U-3             Dressing: honeycomb with steri-strips c/d/i              Incision:  approximated with sutures / no erythema / no ecchymosis / no drainage  Perineum: intact  Lochia: small, no clots  Extremities: +1 BLE edema, no calf pain or tenderness,   A:        POD # 1 S/P [facility] LT[facility] for Vasa Previa            S/p Right partial salpingectomy (left tube surgically absent)  ABL Anemia - stable on oral FE   P:        Routine postoperative care              Discharge home today  [facility] discharge book and instructions reviewed   Recommend continuing Colace daily   F/u with Dr. Cherly Hensen in 6 weeks   Carlean Jews, MSN, CNM [facility]

## 2018-02-07 NOTE — Lactation Note (Signed)
This note was copied from a baby's chart. Lactation Consultation Note  Patient Name: Haley Church [id number] Date: 02/07/2018 Reason for consult: Late-preterm 34-36.6wks;Follow-up assessment   P3, Baby 68 hours old.  [redacted]w[redacted]d.  .1% weight loss in the last 24 hours and stools are green.  Mother latched baby easily in cradle hold.  Intermittent swallows observed. Demonstrating after breastfeeding how to finger syringe feed baby and he received 8 ml. Mother knows to supplement with breastmilk or formula between 20-30 ml per feeding today increasing each day of life. She recently pumped 12 ml.  Reviewed late preterm policy.  Mother is using 5 french at breast but know she can transition to slow flow nipple bottles when volume increases if desired.  Keeping in mind to breastfeed first. Reviewed engorgement care and monitoring voids/stools. Mom encouraged to feed baby 8-12 times/24 hours and with feeding cues at least q 3 hours.  Mother has personal DEBP and aware of OP appt if needed.      Maternal Data    Feeding Feeding Type: Breast Fed Length of feed: 10 min  LATCH Score Latch: Grasps breast easily, tongue down, lips flanged, rhythmical sucking.  Audible Swallowing: A few with stimulation  Type of Nipple: Everted at rest and after stimulation  Comfort (Breast/Nipple): Soft / non-tender  Hold (Positioning): No assistance needed to correctly position infant at breast.  LATCH Score: 9  Interventions Interventions: Breast compression;DEBP  Lactation Tools Discussed/Used     Consult Status Consult Status: Complete    Hardie Pulley 02/07/2018, 9:07 AM

## 2018-02-07 NOTE — Addendum Note (Signed)
Addendum  created 02/07/18 0951 by Phillips Grout, MD   Intraprocedure Event deleted

## 2018-04-05 ENCOUNTER — Ambulatory Visit: Payer: BLUE CROSS/BLUE SHIELD | Admitting: Physician Assistant

## 2018-04-08 ENCOUNTER — Ambulatory Visit: Payer: BLUE CROSS/BLUE SHIELD | Admitting: Physician Assistant

## 2018-04-08 ENCOUNTER — Encounter: Payer: Self-pay | Admitting: Physician Assistant

## 2018-04-08 VITALS — BP 143/58 | HR 81 | Ht 69.02 in | Wt 205.0 lb

## 2018-04-08 DIAGNOSIS — F900 Attention-deficit hyperactivity disorder, predominantly inattentive type: Secondary | ICD-10-CM

## 2018-04-08 DIAGNOSIS — M79675 Pain in left toe(s): Secondary | ICD-10-CM | POA: Diagnosis not present

## 2018-04-08 MED ORDER — LISDEXAMFETAMINE DIMESYLATE 20 MG PO CAPS: 20.0000 mg | ORAL_CAPSULE | ORAL | 0 refills | Status: DC

## 2018-04-08 NOTE — Progress Notes (Signed)
   Subjective:    Patient ID: Haley Church, female    DOB: 1975-12-11, 42 y.o.   MRN: [id number]  HPI  Pt is a 42 yo female who presents to the clinic to restart medication for ADHD and discuss left toe pain.   Pt is post partum 2 months and ready to start back on ADHd medication. She never liked adderall because has always felt jittery on it along with concerta in the past. She wonders if there is anything else out there.   She has been having left toe pain when anything hits her left toenail for last month. She has a hx of ingrown toenails and had a procedure where her nail was stopped from growing. Her toenail no longer grows.  Now both great toenails are thick and her left toenail seems to look like it might be growing down at the tip. No known new trauma. No blood or pus.   .. Active Ambulatory Problems    Diagnosis Date Noted  . ADHD (attention deficit hyperactivity disorder) 10/06/2013  . Anxiety as acute reaction to exceptional stress 12/17/2014  . Postpartum care following cesarean delivery 02/04/2018  . S/P cesarean section - indication: Vasa Previa 5/10 02/05/2018  . Maternal anemia, with delivery 02/05/2018   Resolved Ambulatory Problems    Diagnosis Date Noted  . Postpartum care following vaginal delivery (6/16) 03/14/2013  . Subacromial bursitis 10/03/2013  . Plantar fasciitis, right 10/31/2013  . Onychomycosis 11/02/2013  . Biceps tendinitis on left 11/06/2013  . Sebaceous cyst 05/15/2014  . Metatarsal fracture 10/10/2014  . Depression 09/03/2015  . History of miscarriage 12/10/2015  . Other fatigue 02/26/2016  . Missed periods 02/26/2016  . Moody 02/26/2016  . Gastritis 04/07/2016  . Acute maxillary sinusitis 08/27/2016  . Gastroenteritis 12/11/2016  . Anxiety state 06/30/2017   Past Medical History:  Diagnosis Date  . AMA (advanced maternal age) multigravida 35+   . Anxiety   . Depression   . MTHFR mutation [facility](HCC)       Review of Systems See HPI.      Objective:   Physical Exam  Constitutional: She is oriented to person, place, and time. She appears well-developed and well-nourished.  HENT:  Head: Normocephalic and atraumatic.  Cardiovascular: Normal rate and regular rhythm.  Pulmonary/Chest: Effort normal and breath sounds normal.  Musculoskeletal:  Great left toenail is thick and appears like at the tip is growing down into nailbed. No inflammation, tenderness or redness around the nail bed. Only pain when presssure put on the great toenail.   Neurological: She is alert and oriented to person, place, and time.  Psychiatric: She has a normal mood and affect. Her behavior is normal.          Assessment & Plan:  [name].[name].Mckensie was seen today for toe pain.  Diagnoses and all orders for this visit:  Toe pain, left -     Ambulatory referral to Podiatry  Attention deficit hyperactivity disorder (ADHD), predominantly inattentive type -     lisdexamfetamine (VYVANSE) 20 MG capsule; Take 1 capsule (20 mg total) by mouth every morning.   Referral made to podiatry consider removing toenail. Reassurance given I did not see any infection. Toenails are thick and thought about a fungus but her nails do not grow since her ingrown toenail procedure.   Will try vyvanse due to intolerance to adderall and concerta. Free 30 days given. Follow up in 2 months.

## 2018-04-10 ENCOUNTER — Encounter: Payer: Self-pay | Admitting: Physician Assistant

## 2018-04-19 ENCOUNTER — Ambulatory Visit: Payer: Self-pay | Admitting: Podiatry

## 2018-04-20 ENCOUNTER — Encounter: Payer: Self-pay | Admitting: Podiatry

## 2018-04-20 ENCOUNTER — Ambulatory Visit (INDEPENDENT_AMBULATORY_CARE_PROVIDER_SITE_OTHER): Payer: BLUE CROSS/BLUE SHIELD | Admitting: Podiatry

## 2018-04-20 VITALS — BP 135/86 | HR 79 | Ht 69.0 in | Wt 195.0 lb

## 2018-04-20 DIAGNOSIS — M722 Plantar fascial fibromatosis: Secondary | ICD-10-CM

## 2018-04-20 DIAGNOSIS — M898X9 Other specified disorders of bone, unspecified site: Secondary | ICD-10-CM | POA: Diagnosis not present

## 2018-04-20 DIAGNOSIS — L600 Ingrowing nail: Secondary | ICD-10-CM | POA: Diagnosis not present

## 2018-04-20 NOTE — Patient Instructions (Addendum)
Seen for painful nails. Noted of abnormal nail growth with subungual exostosis both great toe. Consent form reviewed for the both great toe subungual exostectomy and excision nail and matrix on both great toes. Will also get coverage info on custom orthotics.

## 2018-04-20 NOTE — Progress Notes (Signed)
SUBJECTIVE: 42 y.o. year old female presents with painful deformed nails. Had done ingrown nail surgery done on both great toes 5-6 years ago. Since then the nail has been growing abnormal, thick and ingrown.  Patient also stated that she has heel pain.  Review of Systems  Constitutional: Negative.   HENT: Negative.   Eyes: Negative.   Respiratory: Negative.   Cardiovascular: Negative.   Gastrointestinal: Negative.   Genitourinary: Negative.   Musculoskeletal: Negative.   Skin: Negative.      OBJECTIVE: DERMATOLOGIC EXAMINATION: Thick ingrown and deformed great toe nails, painful.  VASCULAR EXAMINATION OF LOWER LIMBS: All pedal pulses are palpable with normal pulsation.  Capillary Filling times within 3 seconds in all digits.  Temperature gradient from tibial crest to dorsum of foot is within normal bilateral.  NEUROLOGIC EXAMINATION OF THE LOWER LIMBS: All epicritic and tactile sensations grossly intact. Sharp and Dull discriminatory sensations at the plantar ball of hallux is intact bilateral.   MUSCULOSKELETAL EXAMINATION: Positive for enlarged distal stump of both great toes distal to the distal end of nail plate. Post surgical bunion reduced right, positive of bunion deformity left. Elevated first metatarsal bilateral.  RADIOGRAPHIC STUDIES:  AP View:  Normal findings. Lateral view:  Dorsal spur at distal end of the great toe distal phalanx R>L.  ASSESSMENT: Nail deformity both great toes. Subungual exostosis both great toes. Ingrown hallucal nails bilateral. Plantar fasciitis bilateral.  PLAN: Reviewed findings and available treatment options. As per discussion, surgery consent form reviewed for excision of nail and matrix at medial border on left great toe, excision nail and matrix both borders right great toe, and subungual exostectomy both great toes.

## 2018-05-09 ENCOUNTER — Other Ambulatory Visit: Payer: Self-pay

## 2018-05-09 DIAGNOSIS — F900 Attention-deficit hyperactivity disorder, predominantly inattentive type: Secondary | ICD-10-CM

## 2018-05-09 MED ORDER — LISDEXAMFETAMINE DIMESYLATE 20 MG PO CAPS: 20.0000 mg | ORAL_CAPSULE | ORAL | 0 refills | Status: DC

## 2018-05-09 NOTE — Telephone Encounter (Signed)
Jermesha request refill on Vyvanse.

## 2018-05-13 ENCOUNTER — Telehealth: Payer: Self-pay

## 2018-05-13 ENCOUNTER — Encounter: Payer: Self-pay | Admitting: Physician Assistant

## 2018-05-13 ENCOUNTER — Telehealth: Payer: Self-pay | Admitting: *Deleted

## 2018-05-13 MED ORDER — AMPHETAMINE-DEXTROAMPHET ER 25 MG PO CP24: 25.0000 mg | ORAL_CAPSULE | ORAL | 0 refills | Status: DC

## 2018-05-13 NOTE — Telephone Encounter (Signed)
New rx sent for Adderall.  Please have her schedule follow-up with [name] in 1 month since we are switching medications around and please call the pharmacy and cancel the prescription for the Vyvanse.

## 2018-05-13 NOTE — Telephone Encounter (Signed)
Haley Church called and state the Vyvanse cost over $200. She can't afford to pay for the Vyvanse. She would rather go back on the Adderall. Please advise.

## 2018-05-13 NOTE — Telephone Encounter (Signed)
Benefits obtained a few weeks ago and patient was notified. Surgery is scheduled on 05/20/18. Patient has met 2500 deductible and will be responsible for 20% of the allowed amount.. For CPT code [phone] the code is valid and billabel patient has to pay $30 copay and the plan covers 100% of the allowed amount.

## 2018-05-13 NOTE — Telephone Encounter (Signed)
Called pharmacy, cancelled Vyvanse Rx.

## 2018-05-13 NOTE — Telephone Encounter (Signed)
Addressed in MyChart message. No further action required.

## 2018-05-19 ENCOUNTER — Other Ambulatory Visit: Payer: Self-pay | Admitting: Podiatry

## 2018-05-19 MED ORDER — PROMETHAZINE HCL 12.5 MG PO TABS: 12.5000 mg | ORAL_TABLET | Freq: Three times a day (TID) | ORAL | 0 refills | Status: DC | PRN

## 2018-05-19 MED ORDER — MEPERIDINE HCL 50 MG PO TABS: 50.0000 mg | ORAL_TABLET | Freq: Three times a day (TID) | ORAL | 0 refills | Status: DC | PRN

## 2018-05-20 DIAGNOSIS — M25774 Osteophyte, right foot: Secondary | ICD-10-CM | POA: Diagnosis not present

## 2018-05-20 DIAGNOSIS — M25775 Osteophyte, left foot: Secondary | ICD-10-CM | POA: Diagnosis not present

## 2018-05-20 DIAGNOSIS — L600 Ingrowing nail: Secondary | ICD-10-CM

## 2018-05-25 ENCOUNTER — Encounter: Payer: Self-pay | Admitting: Podiatry

## 2018-05-25 ENCOUNTER — Ambulatory Visit (INDEPENDENT_AMBULATORY_CARE_PROVIDER_SITE_OTHER): Payer: BLUE CROSS/BLUE SHIELD | Admitting: Podiatry

## 2018-05-25 DIAGNOSIS — L600 Ingrowing nail: Secondary | ICD-10-CM

## 2018-05-25 DIAGNOSIS — M898X9 Other specified disorders of bone, unspecified site: Secondary | ICD-10-CM

## 2018-05-25 NOTE — Progress Notes (Signed)
Feel better since yesterday. Blood soaked dry dressing removed. Wound cleansed with Iodine and wet to dry dressing applied. Home care instruction given. Coban bandage dispensed x 1. Return in one week.

## 2018-05-25 NOTE — Patient Instructions (Signed)
One week post op wound healing normal on both great toes. Dressing change done. May change dressing after each shower. Return in one week.

## 2018-06-01 ENCOUNTER — Ambulatory Visit (INDEPENDENT_AMBULATORY_CARE_PROVIDER_SITE_OTHER): Payer: BLUE CROSS/BLUE SHIELD | Admitting: Podiatry

## 2018-06-01 DIAGNOSIS — M898X9 Other specified disorders of bone, unspecified site: Secondary | ICD-10-CM

## 2018-06-01 NOTE — Patient Instructions (Signed)
12 days post op wound healing normal on both big toes. All sutures removed. Keep the white tape and replace as needed with supply dispensed. Return in one week.

## 2018-06-01 NOTE — Progress Notes (Signed)
12 days post op, Excision nail matrix with Subungual exostectomy both great toes 05/20/18. Normal wound healing without edema or erythema. Incision site dry and well coapted. Sutures removed. Mefix tape placed. Home care instruction with supply dispensed. Return in one week.

## 2018-06-02 ENCOUNTER — Encounter: Payer: Self-pay | Admitting: Podiatry

## 2018-06-08 ENCOUNTER — Encounter: Payer: Self-pay | Admitting: Podiatry

## 2018-06-08 ENCOUNTER — Ambulatory Visit (INDEPENDENT_AMBULATORY_CARE_PROVIDER_SITE_OTHER): Payer: BLUE CROSS/BLUE SHIELD | Admitting: Podiatry

## 2018-06-08 DIAGNOSIS — M898X9 Other specified disorders of bone, unspecified site: Secondary | ICD-10-CM

## 2018-06-08 NOTE — Progress Notes (Signed)
Well healed post op wound following matrixectomy and subungual exostectomy both great toe. No complication noted. Return as needed.

## 2018-06-08 NOTE — Patient Instructions (Signed)
Noted of normal post op wound healing without complication. Increase activity as tolerated. Return as needed.

## 2018-06-23 ENCOUNTER — Encounter: Payer: Self-pay | Admitting: Physician Assistant

## 2018-06-23 ENCOUNTER — Ambulatory Visit: Payer: BLUE CROSS/BLUE SHIELD | Admitting: Physician Assistant

## 2018-06-23 ENCOUNTER — Telehealth: Payer: Self-pay | Admitting: Physician Assistant

## 2018-06-23 VITALS — BP 125/83 | HR 91 | Ht 69.0 in | Wt 204.0 lb

## 2018-06-23 DIAGNOSIS — Z6830 Body mass index (BMI) 30.0-30.9, adult: Secondary | ICD-10-CM | POA: Diagnosis not present

## 2018-06-23 DIAGNOSIS — E6609 Other obesity due to excess calories: Secondary | ICD-10-CM | POA: Diagnosis not present

## 2018-06-23 DIAGNOSIS — F900 Attention-deficit hyperactivity disorder, predominantly inattentive type: Secondary | ICD-10-CM | POA: Diagnosis not present

## 2018-06-23 MED ORDER — AMPHETAMINE-DEXTROAMPHET ER 30 MG PO CP24: 30.0000 mg | ORAL_CAPSULE | ORAL | 0 refills | Status: DC

## 2018-06-23 NOTE — Telephone Encounter (Signed)
Need last pap

## 2018-06-23 NOTE — Progress Notes (Signed)
   Subjective:    Patient ID: Haley Church, female    DOB: 1976/06/14, 42 y.o.   MRN: [id number]  HPI Pt is a 42 yo female with ADHD who presents to the clinic for 3 month follow up. She is doing well on adderall. She denies any problems or concerns. No increased anxiety or insomnia. She does wish to increase adderall some. She finds that it seems to wear off around 2-3.   She is 4 months post partum. She is struggling with weight. She is not exercising. She admits to make bad choices with food.   .. Active Ambulatory Problems    Diagnosis Date Noted  . ADHD (attention deficit hyperactivity disorder) 10/06/2013  . Anxiety as acute reaction to exceptional stress 12/17/2014  . Postpartum care following cesarean delivery 02/04/2018  . S/P cesarean section - indication: Vasa Previa 5/10 02/05/2018  . Maternal anemia, with delivery 02/05/2018  . Class 1 obesity due to excess calories without serious comorbidity with body mass index (BMI) of 30.0 to 30.9 in adult 06/23/2018   Resolved Ambulatory Problems    Diagnosis Date Noted  . Postpartum care following vaginal delivery (6/16) 03/14/2013  . Subacromial bursitis 10/03/2013  . Plantar fasciitis, right 10/31/2013  . Onychomycosis 11/02/2013  . Biceps tendinitis on left 11/06/2013  . Sebaceous cyst 05/15/2014  . Metatarsal fracture 10/10/2014  . Depression 09/03/2015  . History of miscarriage 12/10/2015  . Other fatigue 02/26/2016  . Missed periods 02/26/2016  . Moody 02/26/2016  . Gastritis 04/07/2016  . Acute maxillary sinusitis 08/27/2016  . Gastroenteritis 12/11/2016  . Anxiety state 06/30/2017   Past Medical History:  Diagnosis Date  . AMA (advanced maternal age) multigravida 35+   . Anxiety   . MTHFR mutation (HCC)       Review of Systems  All other systems reviewed and are negative.      Objective:   Physical Exam  Constitutional: She is oriented to person, place, and time. She appears well-developed and  well-nourished.  HENT:  Head: Normocephalic and atraumatic.  Cardiovascular: Normal rate and regular rhythm.  Pulmonary/Chest: Effort normal and breath sounds normal.  Neurological: She is alert and oriented to person, place, and time.  Psychiatric: She has a normal mood and affect. Her behavior is normal.          Assessment & Plan:  .Diagnoses and all orders for this visit:  Attention deficit hyperactivity disorder (ADHD), predominantly inattentive type -     amphetamine-dextroamphetamine (ADDERALL XR) 30 MG 24 hr capsule; Take 1 capsule (30 mg total) by mouth every morning. -     amphetamine-dextroamphetamine (ADDERALL XR) 30 MG 24 hr capsule; Take 1 capsule (30 mg total) by mouth every morning. -     amphetamine-dextroamphetamine (ADDERALL XR) 30 MG 24 hr capsule; Take 1 capsule (30 mg total) by mouth every morning.  Class 1 obesity due to excess calories without serious comorbidity with body mass index (BMI) of 30.0 to 30.9 in adult   Vitals look great. Refilled for 3 months. Increased the dose by 5mg .   [name].Discussed low carb diet with 1500 calories and 80g of protein.  Exercising at least 150 minutes a week.  [employer] could be a Chief Technology Officer.  Start back on beach body.

## 2018-06-30 NOTE — Telephone Encounter (Signed)
Done. Information has been sent over.

## 2018-07-22 ENCOUNTER — Encounter: Payer: Self-pay | Admitting: Physician Assistant

## 2018-07-22 ENCOUNTER — Ambulatory Visit: Payer: BLUE CROSS/BLUE SHIELD | Admitting: Physician Assistant

## 2018-07-22 VITALS — BP 118/89 | HR 88 | Ht 69.0 in | Wt 201.0 lb

## 2018-07-22 DIAGNOSIS — M722 Plantar fascial fibromatosis: Secondary | ICD-10-CM | POA: Diagnosis not present

## 2018-07-22 DIAGNOSIS — M898X9 Other specified disorders of bone, unspecified site: Secondary | ICD-10-CM

## 2018-07-22 DIAGNOSIS — L929 Granulomatous disorder of the skin and subcutaneous tissue, unspecified: Secondary | ICD-10-CM

## 2018-07-22 DIAGNOSIS — L600 Ingrowing nail: Secondary | ICD-10-CM | POA: Diagnosis not present

## 2018-07-22 MED ORDER — DOXYCYCLINE HYCLATE 100 MG PO TABS: 100.0000 mg | ORAL_TABLET | Freq: Two times a day (BID) | ORAL | 0 refills | Status: DC

## 2018-07-22 NOTE — Patient Instructions (Signed)
Pyogenic Granuloma Pyogenic granuloma is a growth (lesion) that forms on the skin or on the mucous membranes of the mouth. This type of growth is a lump of very red tissue that bleeds easily. A pyogenic granuloma is usually a single lesion that most often affects:  The head and neck.  The mucous membranes of the mouth or tongue.  The upper body.  The hands and feet.  A pyogenic granuloma usually measures about 0.5 inch (1.3 cm), but lesions can be smaller or larger. This condition does not spread from person to person (is not contagious). The lesion is not cancerous (benign). What are the causes? A pyogenic granuloma results from a reaction of your skin or mucous membranes. The reaction causes a mound of tiny blood vessels (capillaries) to form a lesion. This often happens after a minor injury, like pricking your skin or biting your lip or tongue. Sometimes it occurs without an injury. The exact cause of the reaction is not known. What increases the risk? The condition is more likely to develop in:  Pregnant women.  Children and young adults.  People who take certain medicines, especially acne treatment drugs, birth control pills, and some medicines used to treat cancer or HIV/AIDS.  What are the signs or symptoms? The main symptom of this condition is a raised or lumpy lesion that is very red. The lesion may also:  Have a crusty, ulcerated surface.  Bleed easily.  Be slightly sore.  How is this diagnosed? This condition is diagnosed based on your symptoms and medical history, especially if you recently had an injury. Your health care provider will also do a physical exam. Your health care provider may remove a small piece of the granuloma for testing (biopsy) to rule out cancer. How is this treated? A small lesion may go away without treatment. You may have to stop or change any medicines that caused the lesion. Pyogenic granulomas caused by pregnancy usually go away after  delivery. If your legion is large, irritated, or bleeds easily, you may need to have the lesion removed. This may involve:  Scraping away the lesion (curettage).  Using chemicals or electric energy to destroy the lesion.  Removing the lesion along with a small piece of normal skin or mucous membrane (surgical excision). This is the best treatment to prevent the lesion from coming back.  Follow these instructions at home:  Take over-the-counter and prescription medicines only as told by your health care provider.  Keep all follow-up visits as told by your health care provider. This is important. Contact a health care provider if:  You have a fever.  Your lesion bleeds.  Your lesion comes back after treatment. This information is not intended to replace advice given to you by your health care provider. Make sure you discuss any questions you have with your health care provider. Document Released: 09/29/2015 Document Revised: 02/20/2016 Document Reviewed: 09/29/2015 Elsevier Interactive Patient Education  2018 Elsevier Inc[employer]  

## 2018-07-22 NOTE — Progress Notes (Signed)
   Subjective:    Patient ID: Haley Church, female    DOB: August 10, 1976, 42 y.o.   MRN: [id number]  HPI  Pt is a 42 yo with subungual exostosis, bilateral plantar fasciitis, bilateral ingrown toenails who presents to the clinic to follow up after 9/11 procedure to remove medial/lateral ingrown toenails of both feet. She saw Dr. Raynald Kemp but she has closed her office and she needs a new podiatrist referral. Her right lateral side great toe has a erythematous growth with some discharge(mostly clear) coming out from around it. It is a little tender to touch and red. It rubs her shoes and that irritates it.   .. Active Ambulatory Problems    Diagnosis Date Noted  . ADHD (attention deficit hyperactivity disorder) 10/06/2013  . Anxiety as acute reaction to exceptional stress 12/17/2014  . Postpartum care following cesarean delivery 02/04/2018  . S/P cesarean section - indication: Vasa Previa 5/10 02/05/2018  . Maternal anemia, with delivery 02/05/2018  . Class 1 obesity due to excess calories without serious comorbidity with body mass index (BMI) of 30.0 to 30.9 in adult 06/23/2018   Resolved Ambulatory Problems    Diagnosis Date Noted  . Postpartum care following vaginal delivery (6/16) 03/14/2013  . Subacromial bursitis 10/03/2013  . Plantar fasciitis, right 10/31/2013  . Onychomycosis 11/02/2013  . Biceps tendinitis on left 11/06/2013  . Sebaceous cyst 05/15/2014  . Metatarsal fracture 10/10/2014  . Depression 09/03/2015  . History of miscarriage 12/10/2015  . Other fatigue 02/26/2016  . Missed periods 02/26/2016  . Moody 02/26/2016  . Gastritis 04/07/2016  . Acute maxillary sinusitis 08/27/2016  . Gastroenteritis 12/11/2016  . Anxiety state 06/30/2017   Past Medical History:  Diagnosis Date  . AMA (advanced maternal age) multigravida 35+   . Anxiety   . MTHFR mutation [facility])      Review of Systems See HPI.     Objective:   Physical Exam  HENT:  Head: Normocephalic and  atraumatic.  Cardiovascular: Normal rate.  Pulmonary/Chest: Effort normal.  Neurological: She is alert.  Skin:  Right lateral side toenail with a bloody red growth coming out of nail edge. Tenderness to palpation and clear drainage expressed. There is some surround erythema to to soft tissue of toe.   Medial/lateral toenail removed on both great toes.           Assessment & Plan:  Marland KitchenMarland KitchenDiagnoses and all orders for this visit:  Granuloma of great toe -     doxycycline (VIBRA-TABS) 100 MG tablet; Take 1 tablet (100 mg total) by mouth 2 (two) times daily. For 10 days. -     Ambulatory referral to Podiatry  Bilateral plantar fasciitis -     Ambulatory referral to Podiatry  Subungual exostosis -     Ambulatory referral to Podiatry  Ingrown toenail of both feet -     Ambulatory referral to Podiatry  appears like a granuloma of the right toe. Will refer to podiatry. Discussed with patient could go away on its own but my need excision. There could also be other treatments that I am not aware of. There is some appearance of paronychia will treat with doxycycline to resolve any infection. Keep covered in shoes for now. Follow up as needed.

## 2018-07-23 ENCOUNTER — Encounter: Payer: Self-pay | Admitting: Physician Assistant

## 2018-07-23 DIAGNOSIS — L600 Ingrowing nail: Secondary | ICD-10-CM | POA: Insufficient documentation

## 2018-07-23 DIAGNOSIS — L929 Granulomatous disorder of the skin and subcutaneous tissue, unspecified: Secondary | ICD-10-CM | POA: Insufficient documentation

## 2018-07-23 DIAGNOSIS — M722 Plantar fascial fibromatosis: Secondary | ICD-10-CM | POA: Insufficient documentation

## 2018-07-23 DIAGNOSIS — M898X9 Other specified disorders of bone, unspecified site: Secondary | ICD-10-CM | POA: Insufficient documentation

## 2018-07-26 ENCOUNTER — Other Ambulatory Visit: Payer: Self-pay | Admitting: Physician Assistant

## 2018-07-26 DIAGNOSIS — F900 Attention-deficit hyperactivity disorder, predominantly inattentive type: Secondary | ICD-10-CM

## 2018-07-26 MED ORDER — AMPHETAMINE-DEXTROAMPHET ER 30 MG PO CP24: 30.0000 mg | ORAL_CAPSULE | ORAL | 0 refills | Status: DC

## 2018-08-12 ENCOUNTER — Encounter: Payer: Self-pay | Admitting: Physician Assistant

## 2018-08-22 ENCOUNTER — Encounter: Payer: Self-pay | Admitting: Physician Assistant

## 2018-08-29 ENCOUNTER — Encounter: Payer: Self-pay | Admitting: Physician Assistant

## 2018-08-29 ENCOUNTER — Ambulatory Visit: Payer: BLUE CROSS/BLUE SHIELD | Admitting: Physician Assistant

## 2018-08-29 VITALS — BP 146/95 | HR 89 | Ht 69.0 in | Wt 206.0 lb

## 2018-08-29 DIAGNOSIS — F900 Attention-deficit hyperactivity disorder, predominantly inattentive type: Secondary | ICD-10-CM | POA: Diagnosis not present

## 2018-08-29 DIAGNOSIS — Z6830 Body mass index (BMI) 30.0-30.9, adult: Secondary | ICD-10-CM | POA: Diagnosis not present

## 2018-08-29 DIAGNOSIS — E6609 Other obesity due to excess calories: Secondary | ICD-10-CM | POA: Diagnosis not present

## 2018-08-29 MED ORDER — PHENTERMINE HCL 37.5 MG PO TABS: 37.5000 mg | ORAL_TABLET | Freq: Every day | ORAL | 0 refills | Status: DC

## 2018-08-29 NOTE — Progress Notes (Signed)
   Subjective:    Patient ID: Haley MeuseAmber Rovito, female    DOB: 1976-02-16, 42 y.o.   MRN: [id number]  HPI Patient is a 42 year old female with ADHD who presents to the clinic to discuss weight loss.  She is using Adderall for her ADHD which is helping but she is struggling with weight.  She has used phentermine in the past and wonders if she could use phentermine for a few months to lose weight and then go back on the Adderall.  She had a baby 6 months ago and is struggling to get motivated to exercise and eat right.  She was previously a [employer].  She denies any problems or concerns with phentermine in the past.  .. Active Ambulatory Problems    Diagnosis Date Noted  . ADHD (attention deficit hyperactivity disorder) 10/06/2013  . Anxiety as acute reaction to exceptional stress 12/17/2014  . Postpartum care following cesarean delivery 02/04/2018  . S/P cesarean section - indication: Vasa Previa 5/10 02/05/2018  . Maternal anemia, with delivery 02/05/2018  . Class 1 obesity due to excess calories without serious comorbidity with body mass index (BMI) of 30.0 to 30.9 in adult 06/23/2018  . Granuloma of great toe 07/23/2018  . Bilateral plantar fasciitis 07/23/2018  . 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 Ingrown toenail of both feet 07/23/2018   Resolved Ambulatory Problems    Diagnosis Date Noted  . Postpartum care following vaginal delivery (6/16) 03/14/2013  . Subacromial bursitis 10/03/2013  . Plantar fasciitis, right 10/31/2013  . Onychomycosis 11/02/2013  . Biceps tendinitis on left 11/06/2013  . Sebaceous cyst 05/15/2014  . Metatarsal fracture 10/10/2014  . Depression 09/03/2015  . History of miscarriage 12/10/2015  . Other fatigue 02/26/2016  . Missed periods 02/26/2016  . Moody 02/26/2016  . Gastritis 04/07/2016  . Acute maxillary sinusitis 08/27/2016  . Gastroenteritis 12/11/2016  . Anxiety state 06/30/2017   Past Medical History:  Diagnosis Date  . AMA  (advanced maternal age) multigravida 35+   . Anxiety   . MTHFR mutation [facility](HCC)       Review of Systems See HPI.     Objective:   Physical Exam  Constitutional: She appears well-developed and well-nourished.  HENT:  Head: Normocephalic and atraumatic.  Cardiovascular: Normal rate and regular rhythm.  Pulmonary/Chest: Effort normal.  Neurological: She is alert.  Psychiatric: She has a normal mood and affect. Her behavior is normal.          Assessment & Plan:  [name].[name].Diagnoses and all orders for this visit:  Class 1 obesity due to excess calories without serious comorbidity with body mass index (BMI) of 30.0 to 30.9 in adult -     phentermine (ADIPEX-P) 37.5 MG tablet; Take 1 tablet (37.5 mg total) by mouth daily before breakfast.  Attention deficit hyperactivity disorder (ADHD), predominantly inattentive type   BMI 30.4.  Patient aware she is in the obese category.  I sent a message to the pharmacy to hold her Adderall.  She will start phentermine.  Discussed this is only a short-term option.  Discussed other longer-term options such as Topamax/Wellbutrin.  Patient would like to only do phentermine at this time.  Strongly encouraged at least 150 minutes of exercise a week.  Certainly she needs to start watching her caloric intake making better food choices and portion control.  Follow-up in 1 month with a nurse visit for side effect and weight assessment.

## 2018-08-31 ENCOUNTER — Encounter: Payer: Self-pay | Admitting: Physician Assistant

## 2018-09-09 NOTE — Telephone Encounter (Signed)
Still don't have pap. Pleas ecall for report/path

## 2018-09-15 NOTE — Telephone Encounter (Signed)
Can we check on this?

## 2018-09-15 NOTE — Telephone Encounter (Signed)
Called and left patient a voicemail to call back and let us know where most recent pap was done. Call back information provided.

## 2018-09-26 ENCOUNTER — Ambulatory Visit: Payer: BLUE CROSS/BLUE SHIELD | Admitting: Physician Assistant

## 2018-09-30 NOTE — Telephone Encounter (Signed)
Left note on patient's appointment desk note to remind [place] to ask her about her pap when she comes in for her appointment next week .

## 2018-10-03 ENCOUNTER — Encounter: Payer: Self-pay | Admitting: Physician Assistant

## 2018-10-03 ENCOUNTER — Ambulatory Visit (INDEPENDENT_AMBULATORY_CARE_PROVIDER_SITE_OTHER): Payer: BLUE CROSS/BLUE SHIELD | Admitting: Physician Assistant

## 2018-10-03 VITALS — BP 138/88 | HR 97 | Wt 210.0 lb

## 2018-10-03 DIAGNOSIS — Z6831 Body mass index (BMI) 31.0-31.9, adult: Secondary | ICD-10-CM

## 2018-10-03 DIAGNOSIS — E6609 Other obesity due to excess calories: Secondary | ICD-10-CM | POA: Diagnosis not present

## 2018-10-03 MED ORDER — PHENTERMINE HCL 37.5 MG PO TABS: 37.5000 mg | ORAL_TABLET | Freq: Every day | ORAL | 0 refills | Status: DC

## 2018-10-03 NOTE — Progress Notes (Signed)
   Subjective:    Patient ID: Haley MeuseAmber Church, female    DOB: 1976-02-23, 43 y.o.   MRN: [id number]  HPI  Pt is a 43 yo obese female who presents to the clinic to follow up on phentermine.   She has gained 4lbs but admits she has not made good choices. She is also not exercising. She started 21 day fix today and the keto diet. No side effects of phentermine. No insomnia, anxiety or palpitations.   .. Active Ambulatory Problems    Diagnosis Date Noted  . ADHD (attention deficit hyperactivity disorder) 10/06/2013  . Anxiety as acute reaction to exceptional stress 12/17/2014  . Postpartum care following cesarean delivery 02/04/2018  . S/P cesarean section - indication: Vasa Previa 5/10 02/05/2018  . Maternal anemia, with delivery 02/05/2018  . Class 1 obesity due to excess calories without serious comorbidity with body mass index (BMI) of 30.0 to 30.9 in adult 06/23/2018  . Granuloma of great toe 07/23/2018  . Bilateral plantar fasciitis 07/23/2018  . 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 Ingrown toenail of both feet 07/23/2018   Resolved Ambulatory Problems    Diagnosis Date Noted  . Postpartum care following vaginal delivery (6/16) 03/14/2013  . Subacromial bursitis 10/03/2013  . Plantar fasciitis, right 10/31/2013  . Onychomycosis 11/02/2013  . Biceps tendinitis on left 11/06/2013  . Sebaceous cyst 05/15/2014  . Metatarsal fracture 10/10/2014  . Depression 09/03/2015  . History of miscarriage 12/10/2015  . Other fatigue 02/26/2016  . Missed periods 02/26/2016  . Moody 02/26/2016  . Gastritis 04/07/2016  . Acute maxillary sinusitis 08/27/2016  . Gastroenteritis 12/11/2016  . Anxiety state 06/30/2017   Past Medical History:  Diagnosis Date  . AMA (advanced maternal age) multigravida 35+   . Anxiety   . MTHFR mutation [facility](HCC)      Review of Systems See HPI.     Objective:   Physical Exam Vitals signs reviewed.  Constitutional:      Appearance: Normal appearance.  HENT:    Head: Normocephalic and atraumatic.  Cardiovascular:     Rate and Rhythm: Normal rate and regular rhythm.  Pulmonary:     Effort: Pulmonary effort is normal.  Neurological:     General: No focal deficit present.     Mental Status: She is alert and oriented to person, place, and time.  Psychiatric:        Mood and Affect: Mood normal.           Assessment & Plan:  [name].[name].Chena was seen today for follow-up.  Diagnoses and all orders for this visit:  Class 1 obesity due to excess calories without serious comorbidity with body mass index (BMI) of 31.0 to 31.9 in adult -     phentermine (ADIPEX-P) 37.5 MG tablet; Take 1 tablet (37.5 mg total) by mouth daily before breakfast.   Explained in the next month she much lose weight. Ok with 21 day fix and keto diet. Follow up nurse visit in 1 month.

## 2018-10-31 ENCOUNTER — Ambulatory Visit: Payer: BLUE CROSS/BLUE SHIELD

## 2018-11-11 ENCOUNTER — Ambulatory Visit: Payer: BLUE CROSS/BLUE SHIELD | Admitting: Physician Assistant

## 2018-11-11 ENCOUNTER — Encounter: Payer: Self-pay | Admitting: Physician Assistant

## 2018-11-11 VITALS — BP 128/88 | HR 102 | Ht 69.0 in | Wt 200.0 lb

## 2018-11-11 DIAGNOSIS — E663 Overweight: Secondary | ICD-10-CM | POA: Diagnosis not present

## 2018-11-11 DIAGNOSIS — R030 Elevated blood-pressure reading, without diagnosis of hypertension: Secondary | ICD-10-CM | POA: Diagnosis not present

## 2018-11-11 DIAGNOSIS — F900 Attention-deficit hyperactivity disorder, predominantly inattentive type: Secondary | ICD-10-CM

## 2018-11-11 MED ORDER — AMPHETAMINE-DEXTROAMPHETAMINE 10 MG PO TABS: ORAL_TABLET | ORAL | 0 refills | Status: DC

## 2018-11-11 MED ORDER — AMPHETAMINE-DEXTROAMPHETAMINE 10 MG PO TABS
ORAL_TABLET | ORAL | 0 refills | Status: DC
Start: 2019-01-10 — End: 2019-02-14

## 2018-11-11 MED ORDER — AMPHETAMINE-DEXTROAMPHET ER 30 MG PO CP24
30.0000 mg | ORAL_CAPSULE | ORAL | 0 refills | Status: DC
Start: 2018-12-10 — End: 2019-02-14

## 2018-11-11 MED ORDER — AMPHETAMINE-DEXTROAMPHET ER 30 MG PO CP24
30.0000 mg | ORAL_CAPSULE | ORAL | 0 refills | Status: DC
Start: 2018-11-11 — End: 2019-02-14

## 2018-11-11 MED ORDER — AMPHETAMINE-DEXTROAMPHET ER 30 MG PO CP24
30.0000 mg | ORAL_CAPSULE | ORAL | 0 refills | Status: DC
Start: 2019-01-10 — End: 2019-02-14

## 2018-11-11 NOTE — Progress Notes (Signed)
Subjective:    Patient ID: Haley Church, female    DOB: 1975-10-30, 43 y.o.   MRN: [id number]  HPI Patient is a 43 year old female who presents to the clinic to follow-up on weight loss and phentermine.  She does have diagnosed ADHD and was on Adderall however she switched to phentermine to try to lose more weight.  She has lost 10 pounds since starting phentermine.  She is ready to go back on Adderall.  She feels like she tolerates Adderall better and helps with her day-to-day functions.  She feels more moody on phentermine.  She is currently doing the keto diet and exercising at least 3-4 times a week.  . Active Ambulatory Problems    Diagnosis Date Noted  . ADHD (attention deficit hyperactivity disorder) 10/06/2013  . Anxiety as acute reaction to exceptional stress 12/17/2014  . Postpartum care following cesarean delivery 02/04/2018  . S/P cesarean section - indication: Vasa Previa 5/10 02/05/2018  . Maternal anemia, with delivery 02/05/2018  . Class 1 obesity due to excess calories without serious comorbidity with body mass index (BMI) of 30.0 to 30.9 in adult 06/23/2018  . Granuloma of great toe 07/23/2018  . Bilateral plantar fasciitis 07/23/2018  . 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 Ingrown toenail of both feet 07/23/2018   Resolved Ambulatory Problems    Diagnosis Date Noted  . Postpartum care following vaginal delivery (6/16) 03/14/2013  . Subacromial bursitis 10/03/2013  . Plantar fasciitis, right 10/31/2013  . Onychomycosis 11/02/2013  . Biceps tendinitis on left 11/06/2013  . Sebaceous cyst 05/15/2014  . Metatarsal fracture 10/10/2014  . Depression 09/03/2015  . History of miscarriage 12/10/2015  . Other fatigue 02/26/2016  . Missed periods 02/26/2016  . Moody 02/26/2016  . Gastritis 04/07/2016  . Acute maxillary sinusitis 08/27/2016  . Gastroenteritis 12/11/2016  . Anxiety state 06/30/2017   Past Medical History:  Diagnosis Date  . AMA (advanced maternal age)  multigravida 35+   . Anxiety   . MTHFR mutation (HCC)       Review of Systems  All other systems reviewed and are negative.      Objective:   Physical Exam Vitals signs reviewed.  Constitutional:      Appearance: Normal appearance.  Cardiovascular:     Rate and Rhythm: Regular rhythm. Tachycardia present.     Pulses: Normal pulses.  Pulmonary:     Effort: Pulmonary effort is normal.  Neurological:     General: No focal deficit present.     Mental Status: She is alert and oriented to person, place, and time.  Psychiatric:        Mood and Affect: Mood normal.           Assessment & Plan:  Marland KitchenMarland KitchenAmber was seen today for follow-up.  Diagnoses and all orders for this visit:  Attention deficit hyperactivity disorder (ADHD), predominantly inattentive type -     amphetamine-dextroamphetamine (ADDERALL XR) 30 MG 24 hr capsule; Take 1 capsule (30 mg total) by mouth every morning. -     amphetamine-dextroamphetamine (ADDERALL) 10 MG tablet; Take one tablet in afternoon as needed at 1pm. -     amphetamine-dextroamphetamine (ADDERALL) 10 MG tablet; Take one tablet in afternoon at 1pm. -     amphetamine-dextroamphetamine (ADDERALL XR) 30 MG 24 hr capsule; Take 1 capsule (30 mg total) by mouth every morning. -     amphetamine-dextroamphetamine (ADDERALL XR) 30 MG 24 hr capsule; Take 1 capsule (30 mg total) by mouth every morning. -  amphetamine-dextroamphetamine (ADDERALL) 10 MG tablet; Take one tablet in afternoon at 1pm.  Elevated blood pressure reading  Overweight   Patient's blood pressure is elevated today.  It did go down a little on second recheck.  I looked back at her history and while she has been on phentermine has been elevated.  I think may be the phentermine could be the cause of this.  I would like for patient to start checking her blood pressure at home and/or at the pharmacy.  Please report back readings.  We did stop phentermine today and restart her Adderall.   She was complaining that it was not lasting long enough.  We did give her immediate release to use some in afternoon.  Discussed side effects of Adderall.  Follow-up in 3 months.  If blood pressure has not improved and readings are ranging in the prehypertensive range we should consider medication.  Patient is now overweight category.  Continue with diet and exercise.  She still should get some stimulant appetite suppression with Adderall.

## 2019-01-09 ENCOUNTER — Encounter: Payer: Self-pay | Admitting: Physician Assistant

## 2019-02-14 ENCOUNTER — Ambulatory Visit (INDEPENDENT_AMBULATORY_CARE_PROVIDER_SITE_OTHER): Payer: BLUE CROSS/BLUE SHIELD | Admitting: Physician Assistant

## 2019-02-14 ENCOUNTER — Encounter: Payer: Self-pay | Admitting: Physician Assistant

## 2019-02-14 ENCOUNTER — Telehealth: Payer: Self-pay | Admitting: Neurology

## 2019-02-14 DIAGNOSIS — F900 Attention-deficit hyperactivity disorder, predominantly inattentive type: Secondary | ICD-10-CM | POA: Diagnosis not present

## 2019-02-14 MED ORDER — LISDEXAMFETAMINE DIMESYLATE 50 MG PO CAPS: 50.0000 mg | ORAL_CAPSULE | Freq: Every day | ORAL | 0 refills | Status: DC

## 2019-02-14 NOTE — Telephone Encounter (Signed)
Patient left vm asking for refills on Adderall. Virtual visit scheduled to discuss.

## 2019-02-14 NOTE — Progress Notes (Signed)
Patient ID: Haley Church, female   DOB: 02-Nov-1975, 43 y.o.   MRN: [id number] .Marland KitchenVirtual Visit via Video Note  I connected with Haley Church on 02/14/19 at  1:40 PM EDT by a video enabled telemedicine application and verified that I am speaking with the correct person using two identifiers.  Location: Patient: home Provider: home   I discussed the limitations of evaluation and management by telemedicine and the availability of in person appointments. The patient expressed understanding and agreed to proceed.  History of Present Illness: Pt is a 43 yo female with ADHD who calls in for 3 month refill. She has no problems or concerns. No increased anxiety, palpitations, headaches. She is sleeping well. She does wish she could take one pill a day. She was on vyvanse before and liked better. She was on adderall due to cost.   .. Active Ambulatory Problems    Diagnosis Date Noted  . ADHD (attention deficit hyperactivity disorder) 10/06/2013  . Anxiety as acute reaction to exceptional stress 12/17/2014  . Postpartum care following cesarean delivery 02/04/2018  . S/P cesarean section - indication: Vasa Previa 5/10 02/05/2018  . Maternal anemia, with delivery 02/05/2018  . Granuloma of great toe 07/23/2018  . Bilateral plantar fasciitis 07/23/2018  . 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 Ingrown toenail of both feet 07/23/2018  . Overweight 11/11/2018  . Elevated blood pressure reading 11/11/2018   Resolved Ambulatory Problems    Diagnosis Date Noted  . Postpartum care following vaginal delivery (6/16) 03/14/2013  . Subacromial bursitis 10/03/2013  . Plantar fasciitis, right 10/31/2013  . Onychomycosis 11/02/2013  . Biceps tendinitis on left 11/06/2013  . Sebaceous cyst 05/15/2014  . Metatarsal fracture 10/10/2014  . Depression 09/03/2015  . History of miscarriage 12/10/2015  . Other fatigue 02/26/2016  . Missed periods 02/26/2016  . Moody 02/26/2016  . Gastritis 04/07/2016  . Acute  maxillary sinusitis 08/27/2016  . Gastroenteritis 12/11/2016  . Anxiety state 06/30/2017  . Class 1 obesity due to excess calories without serious comorbidity with body mass index (BMI) of 30.0 to 30.9 in adult 06/23/2018   Past Medical History:  Diagnosis Date  . AMA (advanced maternal age) multigravida 35+   . Anxiety   . MTHFR mutation [facility] [place])    Reviewed med, allergy, problem list.     Observations/Objective: No acute distress. Normal mood.  Normal breathing.  Normal appearance.   .. Today's Vitals   02/14/19 1047  BP: 128/78  Pulse: 92  Height: 5\' 9"  (1.753 m)   Body mass index is 29.53 kg/m.   Assessment and Plan: Marland KitchenMarland KitchenAmber was seen today for adhd.  Diagnoses and all orders for this visit:  Attention deficit hyperactivity disorder (ADHD), predominantly inattentive type -     lisdexamfetamine (VYVANSE) 50 MG capsule; Take 1 capsule (50 mg total) by mouth daily.   Will try vyvanse again. Sent 50mg  where she was at with last refill. Coupon card left at front desk. If affordable will send other 2 months. Follow up in 3 months.   Follow Up Instructions:    I discussed the assessment and treatment plan with the patient. The patient was provided an opportunity to ask questions and all were answered. The patient agreed with the plan and demonstrated an understanding of the instructions.   The patient was advised to call back or seek an in-person evaluation if the symptoms worsen or if the condition fails to improve as anticipated.   Tandy Gaw, PA-C

## 2019-03-22 ENCOUNTER — Other Ambulatory Visit: Payer: Self-pay | Admitting: Neurology

## 2019-03-22 DIAGNOSIS — F900 Attention-deficit hyperactivity disorder, predominantly inattentive type: Secondary | ICD-10-CM

## 2019-03-22 MED ORDER — LISDEXAMFETAMINE DIMESYLATE 50 MG PO CAPS: 50.0000 mg | ORAL_CAPSULE | Freq: Every day | ORAL | 0 refills | Status: DC

## 2019-03-22 NOTE — Telephone Encounter (Signed)
Patient left vm stating Vyvanse worked well for her and cost okay. Wants [place] to send other two months. Please sign RX.

## 2019-04-24 ENCOUNTER — Telehealth: Payer: Self-pay | Admitting: Neurology

## 2019-04-24 NOTE — Telephone Encounter (Signed)
Patient called for refill on Vyvanse. She has one at pharmacy dated for 04/21/2019. She states she has not contacted them. Advised to contact pharmacy for refill.

## 2019-05-02 ENCOUNTER — Encounter: Payer: Self-pay | Admitting: Physician Assistant

## 2019-05-02 MED ORDER — LISDEXAMFETAMINE DIMESYLATE 60 MG PO CAPS: 60.0000 mg | ORAL_CAPSULE | ORAL | 0 refills | Status: DC

## 2019-05-22 ENCOUNTER — Ambulatory Visit (INDEPENDENT_AMBULATORY_CARE_PROVIDER_SITE_OTHER): Payer: BLUE CROSS/BLUE SHIELD | Admitting: Physician Assistant

## 2019-05-22 ENCOUNTER — Other Ambulatory Visit: Payer: Self-pay

## 2019-05-22 ENCOUNTER — Ambulatory Visit (INDEPENDENT_AMBULATORY_CARE_PROVIDER_SITE_OTHER): Payer: BLUE CROSS/BLUE SHIELD

## 2019-05-22 ENCOUNTER — Encounter: Payer: Self-pay | Admitting: Physician Assistant

## 2019-05-22 VITALS — BP 134/80 | HR 95 | Temp 98.5°F | Ht 69.0 in | Wt 199.0 lb

## 2019-05-22 DIAGNOSIS — S8992XA Unspecified injury of left lower leg, initial encounter: Secondary | ICD-10-CM

## 2019-05-22 DIAGNOSIS — S93402A Sprain of unspecified ligament of left ankle, initial encounter: Secondary | ICD-10-CM

## 2019-05-22 DIAGNOSIS — S8012XA Contusion of left lower leg, initial encounter: Secondary | ICD-10-CM | POA: Diagnosis not present

## 2019-05-22 DIAGNOSIS — F900 Attention-deficit hyperactivity disorder, predominantly inattentive type: Secondary | ICD-10-CM | POA: Diagnosis not present

## 2019-05-22 MED ORDER — IBUPROFEN 800 MG PO TABS: 800.0000 mg | ORAL_TABLET | Freq: Three times a day (TID) | ORAL | 0 refills | Status: DC | PRN

## 2019-05-22 NOTE — Progress Notes (Signed)
Subjective:     Patient ID: Haley Church, female   DOB: January 15, 1976, 43 y.o.   MRN: [id number]  HPI  Pt is a 43 yo female who presents to the clinic with bruising and tenderness of left leg and ankle after falling down stairs on Saturday, 3 days ago. She had something in her hands and then tripped over baby gate and slid down her anterior left leg with left foot plantar flexed down 7 steps. She got up and walked on it immediately. Swelling, bruising and pain came later. She has not iced or taken any medication for it. She is able to bear weight but it is tender to touch.   She needs refills of vyvanse. She is doing well. No problems or concerns. She denies any insomnia, anxiety, palpitations, or headaches.   .. Active Ambulatory Problems    Diagnosis Date Noted  . ADHD (attention deficit hyperactivity disorder) 10/06/2013  . Anxiety as acute reaction to exceptional stress 12/17/2014  . Postpartum care following cesarean delivery 02/04/2018  . S/P cesarean section - indication: Vasa Previa 5/10 02/05/2018  . Maternal anemia, with delivery 02/05/2018  . Granuloma of great toe 07/23/2018  . Bilateral plantar fasciitis 07/23/2018  . 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 Ingrown toenail of both feet 07/23/2018  . Overweight 11/11/2018  . Elevated blood pressure reading 11/11/2018   Resolved Ambulatory Problems    Diagnosis Date Noted  . Postpartum care following vaginal delivery (6/16) 03/14/2013  . Subacromial bursitis 10/03/2013  . Plantar fasciitis, right 10/31/2013  . Onychomycosis 11/02/2013  . Biceps tendinitis on left 11/06/2013  . Sebaceous cyst 05/15/2014  . Metatarsal fracture 10/10/2014  . Depression 09/03/2015  . History of miscarriage 12/10/2015  . Other fatigue 02/26/2016  . Missed periods 02/26/2016  . Moody 02/26/2016  . Gastritis 04/07/2016  . Acute maxillary sinusitis 08/27/2016  . Gastroenteritis 12/11/2016  . Anxiety state 06/30/2017  . Class 1 obesity due to excess  calories without serious comorbidity with body mass index (BMI) of 30.0 to 30.9 in adult 06/23/2018   Past Medical History:  Diagnosis Date  . AMA (advanced maternal age) multigravida 35+   . Anxiety   . MTHFR mutation [facility](HCC)        Review of Systems See HPI.     Objective:   Physical Exam Vitals signs reviewed.  Constitutional:      Appearance: Normal appearance.  Cardiovascular:     Rate and Rhythm: Normal rate and regular rhythm.     Pulses: Normal pulses.  Pulmonary:     Effort: Pulmonary effort is normal.  Musculoskeletal:     Comments: Left leg: Extensive bruising from mid anterior leg down into left anterior foot and medial and lateral ankle.  Superficial abrasion over tibia linear 8cm long.  NROM of left foot.  Strength of left foot 5/5.  Tenderness over anterior leg, anterior foot, all 3 ligaments of lateral malleolus.   Neurological:     General: No focal deficit present.     Mental Status: She is alert and oriented to person, place, and time.  Psychiatric:        Mood and Affect: Mood normal.        Behavior: Behavior normal.        Assessment:     [name].[name].Adelae was seen today for leg pain.  Diagnoses and all orders for this visit:  Injury of left lower leg, initial encounter -     DG Tibia/Fibula Left -  ibuprofen (ADVIL) 800 MG tablet; Take 1 tablet (800 mg total) by mouth every 8 (eight) hours as needed.  Contusion of left lower leg, initial encounter -     ibuprofen (ADVIL) 800 MG tablet; Take 1 tablet (800 mg total) by mouth every 8 (eight) hours as needed.  Sprain of left ankle, unspecified ligament, initial encounter -     ibuprofen (ADVIL) 800 MG tablet; Take 1 tablet (800 mg total) by mouth every 8 (eight) hours as needed.  Attention deficit hyperactivity disorder (ADHD), predominantly inattentive type -     lisdexamfetamine (VYVANSE) 60 MG capsule; Take 1 capsule (60 mg total) by mouth every morning. -     lisdexamfetamine (VYVANSE) 60 MG  capsule; Take 1 capsule (60 mg total) by mouth every morning. -     lisdexamfetamine (VYVANSE) 60 MG capsule; Take 1 capsule (60 mg total) by mouth every morning.      Plan:     Reviewed xray. No sign of fracture.  Discussed contusion of lower leg and sprain of lateral ankle. Rest, ICE, Compress, and elevate. Ibuprofen as needed. Follow up with any worsening pain or mobility.  After 3 days can start exercises to help build ankle strength back up. Suggest wearing ace wrap/ankle compression for at least 6 weeks for stability.    Refilled vyvanse for 3 months.

## 2019-05-22 NOTE — Patient Instructions (Signed)
Ankle Sprain, Phase I Rehab An ankle sprain is an injury to the ligaments of your ankle. Ankle sprains cause stiffness, loss of motion, and loss of strength. Ask your health care provider which exercises are safe for you. Do exercises exactly as told by your health care provider and adjust them as directed. It is normal to feel mild stretching, pulling, tightness, or discomfort as you do these exercises. Stop right away if you feel sudden pain or your pain gets worse. Do not begin these exercises until told by your health care provider. Stretching and range-of-motion exercises These exercises warm up your muscles and joints and improve the movement and flexibility of your lower leg and ankle. These exercises also help to relieve pain and stiffness. Gastroc and soleus stretch This exercise is also called a calf stretch. It stretches the muscles in the back of the lower leg. These muscles are the gastrocnemius, or gastroc, and the soleus. 1. Sit on the floor with your left / right leg extended. 2. Loop a belt or towel around the ball of your left / right foot. The ball of your foot is on the walking surface, right under your toes. 3. Keep your left / right ankle and foot relaxed and keep your knee straight while you use the belt or towel to pull your foot toward you. You should feel a gentle stretch behind your calf or knee in your gastroc muscle. 4. Hold this position for __________ seconds, then release to the starting position. 5. Repeat the exercise with your knee bent. You can put a pillow or a rolled bath towel under your knee to support it. You should feel a stretch deep in your calf in the soleus muscle or at your Achilles tendon. Repeat __________ times. Complete this exercise __________ times a day. Ankle alphabet  1. Sit with your left / right leg supported at the lower leg. ? Do not rest your foot on anything. ? Make sure your foot has room to move freely. 2. Think of your left / right  foot as a paintbrush. ? Move your foot to trace each letter of the alphabet in the air. Keep your hip and knee still while you trace. ? Make the letters as large as you can without feeling discomfort. 3. Trace every letter from A to Z. Repeat __________ times. Complete this exercise __________ times a day. Strengthening exercises These exercises build strength and endurance in your ankle and lower leg. Endurance is the ability to use your muscles for a long time, even after they get tired. Ankle dorsiflexion  1. Secure a rubber exercise band or tube to an object, such as a table leg, that will stay still when the band is pulled. Secure the other end around your left / right foot. 2. Sit on the floor facing the object, with your left / right leg extended. The band or tube should be slightly tense when your foot is relaxed. 3. Slowly bring your foot toward you, bringing the top of your foot toward your shin (dorsiflexion), and pulling the band tighter. 4. Hold this position for __________ seconds. 5. Slowly return your foot to the starting position. Repeat __________ times. Complete this exercise __________ times a day. Ankle plantar flexion  1. Sit on the floor with your left / right leg extended. 2. Loop a rubber exercise tube or band around the ball of your left / right foot. The ball of your foot is on the walking surface, right under your   toes. ? Hold the ends of the band or tube in your hands. ? The band or tube should be slightly tense when your foot is relaxed. 3. Slowly point your foot and toes downward to tilt the top of your foot away from your shin (plantar flexion). 4. Hold this position for __________ seconds. 5. Slowly return your foot to the starting position. Repeat __________ times. Complete this exercise __________ times a day. Ankle eversion 1. Sit on the floor with your legs straight out in front of you. 2. Loop a rubber exercise band or tube around the ball of your left  / right foot. The ball of your foot is on the walking surface, right under your toes. ? Hold the ends of the band in your hands, or secure the band to a stable object. ? The band or tube should be slightly tense when your foot is relaxed. 3. Slowly push your foot outward, away from your other leg (eversion). 4. Hold this position for __________ seconds. 5. Slowly return your foot to the starting position. Repeat __________ times. Complete this exercise __________ times a day. This information is not intended to replace advice given to you by your health care provider. Make sure you discuss any questions you have with your health care provider. Document Released: 04/15/2005 Document Revised: 01/03/2019 Document Reviewed: 06/27/2018 Elsevier Patient Education  2020 [employer]  

## 2019-05-23 MED ORDER — LISDEXAMFETAMINE DIMESYLATE 60 MG PO CAPS: 60.0000 mg | ORAL_CAPSULE | ORAL | 0 refills | Status: DC

## 2019-10-06 ENCOUNTER — Ambulatory Visit (INDEPENDENT_AMBULATORY_CARE_PROVIDER_SITE_OTHER): Payer: BC Managed Care – PPO | Admitting: Physician Assistant

## 2019-10-06 ENCOUNTER — Encounter: Payer: Self-pay | Admitting: Physician Assistant

## 2019-10-06 DIAGNOSIS — F900 Attention-deficit hyperactivity disorder, predominantly inattentive type: Secondary | ICD-10-CM

## 2019-10-06 MED ORDER — LISDEXAMFETAMINE DIMESYLATE 70 MG PO CAPS
70.0000 mg | ORAL_CAPSULE | Freq: Every day | ORAL | 0 refills | Status: DC
Start: 2019-12-04 — End: 2020-01-02

## 2019-10-06 MED ORDER — LISDEXAMFETAMINE DIMESYLATE 70 MG PO CAPS: 70.0000 mg | ORAL_CAPSULE | Freq: Every day | ORAL | 0 refills | Status: DC

## 2019-10-06 NOTE — Progress Notes (Signed)
Needs refills of Vyvanse, but wants to discuss increasing dose.

## 2019-10-06 NOTE — Progress Notes (Signed)
Patient ID: Haley Church, female   DOB: [birth year], 44 y.o.   MRN: [id number] .Marland KitchenVirtual Visit via Video Note  I connected with Haley Church on 10/06/19 at 11:10 AM EST by a video enabled telemedicine application and verified that I am speaking with the correct person using two identifiers.  Location: Patient: target Provider: clinic   I discussed the limitations of evaluation and management by telemedicine and the availability of in person appointments. The patient expressed understanding and agreed to proceed.  History of Present Illness: Pt is a 44 yo female with ADHD who calls in for vyvanse refills.   She is doing well. She still finds some difficultly with focus and would like to go up to 70mg  dose. She is raising her daughters baby, working from home, and raising her 2 children at home. She denies any anxiety or depression. She is trying to put her stress into building and home projects. She is sleeping well except for getting up at night with her children.   .. Active Ambulatory Problems    Diagnosis Date Noted  . ADHD (attention deficit hyperactivity disorder) 10/06/2013  . Anxiety as acute reaction to exceptional stress 12/17/2014  . Postpartum care following cesarean delivery 02/04/2018  . S/P cesarean section - indication: Vasa Previa 5/10 02/05/2018  . Maternal anemia, with delivery 02/05/2018  . Granuloma of great toe 07/23/2018  . Bilateral plantar fasciitis 07/23/2018  . 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 Ingrown toenail of both feet 07/23/2018  . Overweight 11/11/2018  . Elevated blood pressure reading 11/11/2018   Resolved Ambulatory Problems    Diagnosis Date Noted  . Postpartum care following vaginal delivery (6/16) 03/14/2013  . Subacromial bursitis 10/03/2013  . Plantar fasciitis, right 10/31/2013  . Onychomycosis 11/02/2013  . Biceps tendinitis on left 11/06/2013  . Sebaceous cyst 05/15/2014  . Metatarsal fracture 10/10/2014  . Depression 09/03/2015  .  History of miscarriage 12/10/2015  . Other fatigue 02/26/2016  . Missed periods 02/26/2016  . Moody 02/26/2016  . Gastritis 04/07/2016  . Acute maxillary sinusitis 08/27/2016  . Gastroenteritis 12/11/2016  . Anxiety state 06/30/2017  . Class 1 obesity due to excess calories without serious comorbidity with body mass index (BMI) of 30.0 to 30.9 in adult 06/23/2018   Past Medical History:  Diagnosis Date  . AMA (advanced maternal age) multigravida 61+   . Anxiety   . MTHFR mutation [facility])    Reviewed med, allergies and problem list.     Observations/Objective: No acute distress. Normal mood and appearance.   .. Today's Vitals   10/06/19 0946  Temp: (!) 97.3 F (36.3 C)  TempSrc: Oral  Weight: 199 lb (90.3 kg)  Height: 5\' 9"  (1.753 m)   Body mass index is 29.39 kg/m.   Assessment and Plan: Marland KitchenMarland KitchenAmber was seen today for adhd.  Diagnoses and all orders for this visit:  Attention deficit hyperactivity disorder (ADHD), predominantly inattentive type -     lisdexamfetamine (VYVANSE) 70 MG capsule; Take 1 capsule (70 mg total) by mouth daily. -     lisdexamfetamine (VYVANSE) 70 MG capsule; Take 1 capsule (70 mg total) by mouth daily. -     lisdexamfetamine (VYVANSE) 70 MG capsule; Take 1 capsule (70 mg total) by mouth daily.   Increased dose. Follow up in 3 months.   Follow Up Instructions:    I discussed the assessment and treatment plan with the patient. The patient was provided an opportunity to ask questions and all were answered. The patient agreed with  the plan and demonstrated an understanding of the instructions.   The patient was advised to call back or seek an in-person evaluation if the symptoms worsen or if the condition fails to improve as anticipated.    Tandy Gaw, PA-C

## 2020-01-02 ENCOUNTER — Ambulatory Visit (INDEPENDENT_AMBULATORY_CARE_PROVIDER_SITE_OTHER): Payer: BLUE CROSS/BLUE SHIELD | Admitting: Physician Assistant

## 2020-01-02 ENCOUNTER — Encounter: Payer: Self-pay | Admitting: Physician Assistant

## 2020-01-02 ENCOUNTER — Other Ambulatory Visit: Payer: Self-pay

## 2020-01-02 VITALS — BP 128/84 | HR 87 | Wt 187.1 lb

## 2020-01-02 DIAGNOSIS — M2550 Pain in unspecified joint: Secondary | ICD-10-CM | POA: Diagnosis not present

## 2020-01-02 DIAGNOSIS — N912 Amenorrhea, unspecified: Secondary | ICD-10-CM

## 2020-01-02 DIAGNOSIS — E663 Overweight: Secondary | ICD-10-CM

## 2020-01-02 MED ORDER — PHENTERMINE HCL 37.5 MG PO TABS: 37.5000 mg | ORAL_TABLET | Freq: Every day | ORAL | 0 refills | Status: DC

## 2020-01-02 NOTE — Progress Notes (Signed)
Subjective:    Patient ID: Haley Church, female    DOB: 04-14-1976, 44 y.o.   MRN: [id number]  HPI  Patient is a 44 year old female who presents to the clinic to discuss her weight, amenorrhea, joint pain.  Patient is most concerned with her weight.  She has started Vyvanse.  It was causing too many mood issues.  She seems more irritable.  She tried Adderall in the past and it did the same.  She is trying to regulate this with exercise and good diet.  She feels stuck with her weight.  She has lost about 13 pounds since January.  She feels like she cannot lose anymore.  She really wants to be down to 150.  She is currently doing a preplanning meal program called Octavia.  She is exercising 4-5 times a week.  She is sleeping great and wakes up feeling rested.  She does admit to being hungry a lot and at the end of the day making worse choices.   She is concerned about early menopause and she has not had a period in the last year.  She denies any hot flashes or dryness issues. She does feel like weight and mood have been a problem.   She also complains of intermittent joint pain.  She has had pain in bilateral elbows, bilateral knees, bilateral ankles.  She denies any redness but at times they will feel swollen.  She feels very stiff in the mornings.  Anti-inflammatories usually do help.  No known injuries.  .. Active Ambulatory Problems    Diagnosis Date Noted  . ADHD (attention deficit hyperactivity disorder) 10/06/2013  . Anxiety as acute reaction to exceptional stress 12/17/2014  . Postpartum care following cesarean delivery 02/04/2018  . S/P cesarean section - indication: Vasa Previa 5/10 02/05/2018  . Maternal anemia, with delivery 02/05/2018  . Granuloma of great toe 07/23/2018  . Bilateral plantar fasciitis 07/23/2018  . 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 Ingrown toenail of both feet 07/23/2018  . Overweight 11/11/2018  . Elevated blood pressure reading 11/11/2018  . 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 Amenorrhea 01/03/2020  . Arthralgia 01/03/2020   Resolved Ambulatory Problems    Diagnosis Date Noted  . Postpartum care following vaginal delivery (6/16) 03/14/2013  . Subacromial bursitis 10/03/2013  . Plantar fasciitis, right 10/31/2013  . Onychomycosis 11/02/2013  . Biceps tendinitis on left 11/06/2013  . Sebaceous cyst 05/15/2014  . Metatarsal fracture 10/10/2014  . Depression 09/03/2015  . History of miscarriage 12/10/2015  . Other fatigue 02/26/2016  . Missed periods 02/26/2016  . Moody 02/26/2016  . Gastritis 04/07/2016  . Acute maxillary sinusitis 08/27/2016  . Gastroenteritis 12/11/2016  . Anxiety state 06/30/2017  . Class 1 obesity due to excess calories without serious comorbidity with body mass index (BMI) of 30.0 to 30.9 in adult 06/23/2018   Past Medical History:  Diagnosis Date  . AMA (advanced maternal age) multigravida 35+   . Anxiety   . MTHFR mutation [facility])      Review of Systems See HPI.     Objective:   Physical Exam Vitals reviewed.  Constitutional:      Appearance: Normal appearance.  HENT:     Head: Normocephalic.  Cardiovascular:     Rate and Rhythm: Normal rate and regular rhythm.  Pulmonary:     Effort: Pulmonary effort is normal.     Breath sounds: Normal breath sounds.  Musculoskeletal:     Cervical back: Normal range of motion.  Lymphadenopathy:  Cervical: No cervical adenopathy.  Neurological:     General: No focal deficit present.     Mental Status: She is alert and oriented to person, place, and time.  Psychiatric:        Mood and Affect: Mood normal.           Assessment & Plan:  Marland KitchenMarland KitchenDiagnoses and all orders for this visit:  Overweight -     phentermine (ADIPEX-P) 37.5 MG tablet; Take 1 tablet (37.5 mg total) by mouth daily before breakfast. -     TSH -     CBC -     Fe+TIBC+Fer -     COMPLETE METABOLIC PANEL WITH GFR  Amenorrhea -     FSH/LH -     Estradiol -     TSH -     CBC -      Fe+TIBC+Fer -     COMPLETE METABOLIC PANEL WITH GFR  Arthralgia, unspecified joint -     Rheumatoid factor -     Cyclic citrul peptide antibody, IgG -     ANA   Patient does not want to stay on her stimulant.  She previously did help to control her appetite but also created mood changes that she did not like.  She is frustrated and feels that she is in a rut with her weight.  She is working out regularly 4-5 times a week.  She is doing a preplanned meal regimen called Octavia.  She feels that she just needs help getting back on track.  She requests phentermine.  I did give her a small quantity of phentermine.  Encouraged her to take 1/2 tablet at last the next 2 months.  I explained this is more of a short-term option.  Strongly encouraged her to look into intermittent fasting.  Suggested the 16: 8 version.  She has not had a menstrual cycle in the last year.  She does wonder about menopause being early.  She is not really having hot flashes or other symptoms.  We will check [facility] today.  Concern for multiple joints that intermittently are painful and stiff.  We will do labs to screen for RA. As needed NSAIds for now.

## 2020-01-03 DIAGNOSIS — M2550 Pain in unspecified joint: Secondary | ICD-10-CM | POA: Insufficient documentation

## 2020-01-03 DIAGNOSIS — N912 Amenorrhea, unspecified: Secondary | ICD-10-CM | POA: Insufficient documentation

## 2020-02-01 ENCOUNTER — Other Ambulatory Visit: Payer: Self-pay | Admitting: Physician Assistant

## 2020-02-01 DIAGNOSIS — E663 Overweight: Secondary | ICD-10-CM

## 2020-02-01 NOTE — Telephone Encounter (Signed)
Last filled 01/02/2020 #30 with no refills Last appt 01/02/2020, it states she was to take 1/2 tablet daily for this to last two months. Please advise.

## 2020-04-02 ENCOUNTER — Ambulatory Visit (INDEPENDENT_AMBULATORY_CARE_PROVIDER_SITE_OTHER): Payer: BLUE CROSS/BLUE SHIELD | Admitting: Nurse Practitioner

## 2020-04-02 ENCOUNTER — Encounter: Payer: Self-pay | Admitting: Nurse Practitioner

## 2020-04-02 ENCOUNTER — Other Ambulatory Visit: Payer: Self-pay

## 2020-04-02 VITALS — BP 126/88 | HR 90 | Temp 98.1°F | Ht 69.0 in | Wt 193.5 lb

## 2020-04-02 DIAGNOSIS — N3090 Cystitis, unspecified without hematuria: Secondary | ICD-10-CM

## 2020-04-02 LAB — POCT URINALYSIS DIP (CLINITEK)
Bilirubin, UA: NEGATIVE
Glucose, UA: NEGATIVE mg/dL
Ketones, POC UA: NEGATIVE mg/dL
Nitrite, UA: NEGATIVE
POC PROTEIN,UA: NEGATIVE
Spec Grav, UA: 1.02 (ref 1.010–1.025)
Urobilinogen, UA: 0.2 E.U./dL
pH, UA: 6 (ref 5.0–8.0)

## 2020-04-02 MED ORDER — NITROFURANTOIN MONOHYD MACRO 100 MG PO CAPS: 100.0000 mg | ORAL_CAPSULE | Freq: Two times a day (BID) | ORAL | 0 refills | Status: DC

## 2020-04-02 NOTE — Patient Instructions (Addendum)

## 2020-04-02 NOTE — Progress Notes (Signed)
Acute Office Visit  Subjective:    Patient ID: Haley Church, female    DOB: 27-Jan-1976, 44 y.o.   MRN: [id number]  Chief Complaint  Patient presents with  . Dysuria    onset: 2d, frequency, stinging, back pain    HPI Patient is in today for dysuria and back pain that started last week. She reports she tried drinking cranberry juice and taking cranberry pill, however, her symptoms continued.   Denies fever, chills, nausea, vomiting, or suprapubic pain.   Past Medical History:  Diagnosis Date  . AMA (advanced maternal age) multigravida 35+   . Anxiety   . Depression   . MTHFR mutation [facility])     Past Surgical History:  Procedure Laterality Date  . CESAREAN SECTION WITH BILATERAL TUBAL LIGATION N/A 02/04/2018   Procedure: CESAREAN SECTION WITH BILATERAL TUBAL LIGATION;  Surgeon: Maxie Better, MD;  Location: [facility];  Service: Obstetrics;  Laterality: N/A;  . DILATION AND EVACUATION N/A 04/09/2015   Procedure: DILATATION AND EVACUATION with chromosome analysis;  Surgeon: Maxie Better, MD;  Location: [facility];  Service: Gynecology;  Laterality: N/A;  . DILATION AND EVACUATION N/A 01/05/2016   Procedure: DILATATION AND EVACUATION;  Surgeon: Maxie Better, MD;  Location: [facility];  Service: Gynecology;  Laterality: N/A;  . FOOT SURGERY    . LAPAROSCOPIC ABDOMINAL EXPLORATION    . OVARIAN CYST REMOVAL      History reviewed. No pertinent family history.  Social History   Socioeconomic History  . Marital status: Married    Spouse name: Not on file  . Number of children: Not on file  . Years of education: Not on file  . Highest education level: Not on file  Occupational History  . Not on file  Tobacco Use  . Smoking status: Never Smoker  . Smokeless tobacco: Never Used  Substance and Sexual Activity  . Alcohol use: No  . Drug use: No  . Sexual activity: Yes  Other Topics Concern  . Not on file  Social History Narrative  . Not on file   Social  Determinants of Health   Financial Resource Strain:   . Difficulty of Paying Living Expenses:   Food Insecurity:   . Worried About Programme researcher, broadcasting/film/video in the Last Year:   . Barista in the Last Year:   Transportation Needs:   . Freight forwarder (Medical):   [name] Lack of Transportation (Non-Medical):   Physical Activity:   . Days of Exercise per Week:   . Minutes of Exercise per Session:   Stress:   . Feeling of Stress :   Social Connections:   . Frequency of Communication with Friends and Family:   . Frequency of Social Gatherings with Friends and Family:   . Attends Religious Services:   . Active Member of Clubs or Organizations:   . Attends Banker Meetings:   [name] Marital Status:   Intimate Partner Violence:   . Fear of Current or Ex-Partner:   . Emotionally Abused:   [name] Physically Abused:   . Sexually Abused:     Outpatient Medications Prior to Visit  Medication Sig Dispense Refill  . ibuprofen (ADVIL) 800 MG tablet Take 1 tablet (800 mg total) by mouth every 8 (eight) hours as needed. 60 tablet 0  . phentermine (ADIPEX-P) 37.5 MG tablet TAKE 1 TABLET BY MOUTH DAILY BEFORE BREAKFAST (Patient not taking: Reported on 04/02/2020) 30 tablet 0   No facility-administered medications prior to  visit.    Allergies  Allergen Reactions  . Percocet [Oxycodone-Acetaminophen] Itching, Palpitations and Rash    Pt states she can take Acetaminophen  . Sulfa Antibiotics Hives and Rash  . Vicodin [Hydrocodone-Acetaminophen] Palpitations and Rash       Objective:    Physical Exam Vitals and nursing note reviewed.  Constitutional:      Appearance: Normal appearance.  HENT:     Head: Normocephalic.  Eyes:     Extraocular Movements: Extraocular movements intact.     Conjunctiva/sclera: Conjunctivae normal.     Pupils: Pupils are equal, round, and reactive to light.  Cardiovascular:     Rate and Rhythm: Normal rate and regular rhythm.     Pulses: Normal  pulses.     Heart sounds: Normal heart sounds.  Pulmonary:     Effort: Pulmonary effort is normal.     Breath sounds: Normal breath sounds.  Abdominal:     General: Abdomen is flat. There is no distension.     Palpations: Abdomen is soft.     Tenderness: There is no abdominal tenderness. There is no right CVA tenderness or left CVA tenderness.  Musculoskeletal:        General: Normal range of motion.     Cervical back: Normal range of motion.  Skin:    General: Skin is warm and dry.     Capillary Refill: Capillary refill takes less than 2 seconds.  Neurological:     General: No focal deficit present.     Mental Status: She is alert and oriented to person, place, and time.  Psychiatric:        Mood and Affect: Mood normal.        Behavior: Behavior normal.        Thought Content: Thought content normal.        Judgment: Judgment normal.     BP 126/88   Pulse 90   Temp 98.1 F (36.7 C) (Oral)   Ht 5\' 9"  (1.753 m)   Wt 193 lb 8 oz (87.8 kg)   SpO2 98%   BMI 28.57 kg/m  Wt Readings from Last 3 Encounters:  04/02/20 193 lb 8 oz (87.8 kg)  01/02/20 187 lb 1.9 oz (84.9 kg)  10/06/19 199 lb (90.3 kg)    There are no preventive care reminders to display for this patient.  There are no preventive care reminders to display for this patient.   Lab Results  Component Value Date   TSH 1.59 02/25/2016   Lab Results  Component Value Date   WBC 19.4 (H) 02/05/2018   HGB 9.5 (L) 02/05/2018   HCT 29.0 (L) 02/05/2018   MCV 92.9 02/05/2018   PLT 216 02/05/2018   Lab Results  Component Value Date   NA 134 (L) 03/14/2013   K 3.4 (L) 03/14/2013   CO2 20 03/14/2013   GLUCOSE 117 (H) 03/14/2013   BUN 4 (L) 03/14/2013   CREATININE 0.67 03/14/2013   BILITOT 0.3 03/14/2013   ALKPHOS 134 (H) 03/14/2013   AST 28 03/14/2013   ALT 13 03/14/2013   PROT 4.7 (L) 03/14/2013   ALBUMIN 2.0 (L) 03/14/2013   CALCIUM 8.4 03/14/2013   No results found for: CHOL No results found for:  HDL No results found for: LDLCALC No results found for: TRIG No results found for: CHOLHDL No results found for: 03/16/2013     Assessment & Plan:   1. Cystitis Symptoms and presentation consistent with acute cystitis. Leukocytes and trace  blood on UA today. Treatment with macrobid twice a day for 5 days. Information provided on symptoms and process of infection. Will send urine for culture and make changes to plan of care as necessary based on results.   PLAN: - POCT URINALYSIS DIP (CLINITEK) - Urine Culture - nitrofurantoin, macrocrystal-monohydrate, (MACROBID) 100 MG capsule; Take 1 capsule (100 mg total) by mouth 2 (two) times daily.  Dispense: 10 capsule; Refill: 0 - Follow-up if symptoms worsen or fail to improve.    Tollie Eth, NP

## 2020-04-04 LAB — URINE CULTURE
MICRO NUMBER:: 10671555
SPECIMEN QUALITY:: ADEQUATE

## 2020-04-04 NOTE — Progress Notes (Signed)
Culture shows growth of staphylococcus saprophyticus. Nitrofurantoin is an effective antibiotic for this organism. Finish the antibiotic and let us know if symptoms come back or don't go away.

## 2020-04-30 ENCOUNTER — Telehealth (INDEPENDENT_AMBULATORY_CARE_PROVIDER_SITE_OTHER): Payer: BLUE CROSS/BLUE SHIELD | Admitting: Nurse Practitioner

## 2020-04-30 ENCOUNTER — Encounter: Payer: Self-pay | Admitting: Nurse Practitioner

## 2020-04-30 DIAGNOSIS — J069 Acute upper respiratory infection, unspecified: Secondary | ICD-10-CM

## 2020-04-30 DIAGNOSIS — H1032 Unspecified acute conjunctivitis, left eye: Secondary | ICD-10-CM | POA: Diagnosis not present

## 2020-04-30 MED ORDER — BENZONATATE 100 MG PO CAPS: 200.0000 mg | ORAL_CAPSULE | Freq: Three times a day (TID) | ORAL | 0 refills | Status: AC

## 2020-04-30 MED ORDER — CROMOLYN SODIUM 4 % OP SOLN: 2.0000 [drp] | Freq: Two times a day (BID) | OPHTHALMIC | 0 refills | Status: DC

## 2020-04-30 NOTE — Progress Notes (Signed)
Virtual Video Visit via MyChart Note  I connected with  [employer] on 04/30/20 at 10:50 AM EDT by the video enabled telemedicine application for , MyChart, and verified that I am speaking with the correct person using two identifiers.   I introduced myself as a Publishing rights manager with the practice. We discussed the limitations of evaluation and management by telemedicine and the availability of in person appointments. The patient expressed understanding and agreed to proceed.  The patient is: at home I am: in the office  Subjective:    CC:  Chief Complaint  Patient presents with  . Eye Problem    left eye pain, was hurting last night and got worse this morning, redness  . URI    onset:2d, nonproductive cough, mild sinus congestion, PND, has not tried taking anything to help    HPI: Haley Church is a 44 y.o. y/o female presenting via MyChart today for left eye pain and cough.  EYE PAIN She reports that last night she noticed her left eye was sore. The soreness is mainly located on the upper eye lid. She denies irritation, vision changes, itching, or feeling as if something is in the eye. She reports the eye is red today, but there is no swelling that she can tell. She has not tried anything for this. Nothing seems to make it better or worse.  COUGH She reports onset of a non-productive cough about 2 days ago. She also reports post nasal drip and some sinus congestion. She reports she thought it was allergy symptoms, but she typically will have runny eyes, runny nose, sinus pain and pressure with this and those are not present. She denies fever, chills, shortness of breath, chest tightness, or feelings as though she cannot take a deep breath.   Past medical history, Surgical history, Family history not pertinant except as noted below, Social history, Allergies, and medications have been entered into the medical record, reviewed, and corrections made.   Review of Systems:  See HPI for  pertinent positive and negatives  Objective:    General: Speaking clearly in complete sentences without any shortness of breath.   Alert and oriented x3.   Normal judgment.  No apparent acute distress. Left conjunctiva is reddened.  She is audible congested with a frequent cough.    Impression and Recommendations:    1. Viral upper respiratory tract infection Symptoms and presentation consistent with viral upper respiratory infection. Alternatively, this could be an exacerbation of allergies that is presenting differently than in the past due to different allergen. Her cough is prevalent and frequent. Prescription for tessalon pearles provided to help with cough. Recommend flonase or nasocort nasal spray and oral allergy medication, such as claritin or zyrtec, to help with symptoms. Discussed that if her symptoms do not improve or worsen by Friday to let me know. At that point there is likely a bacterial component that will require antibiotic therapy. For now, we will utilize conservative management.  - benzonatate (TESSALON) 100 MG capsule; Take 2 capsules (200 mg total) by mouth 3 (three) times daily for 10 days.  Dispense: 60 capsule; Refill: 0  2. Acute conjunctivitis of left eye, unspecified acute conjunctivitis type Symptoms and presentation consistent with conjunctivitis of the left eye, most likely viral or allergic in nature due to URI symptoms that are also present. Discussed the use of warm and cool compress to the eye several times a day and oral medications listed above for allergy symptoms. Prescription for eye drops  sent to pharmacy to help with irritation. Discussed with patient to monitor for increased redness, irritation, or drainage.  If symptoms persist by Friday, will move on to oral antibiotic management as by that time, a bacterial component is likely present. For now, we will utilize conservative management.  - cromolyn (OPTICROM) 4 % ophthalmic solution; Place 2 drops  into the left eye in the morning and at bedtime.  Dispense: 10 mL; Refill: 0    I discussed the assessment and treatment plan with the patient. The patient was provided an opportunity to ask questions and all were answered. The patient agreed with the plan and demonstrated an understanding of the instructions.   The patient was advised to call back or seek an in-person evaluation if the symptoms worsen or if the condition fails to improve as anticipated.  I provided 20 minutes of non-face-to-face interaction with this MYCHART visit including intake, same-day documentation, and chart review.   Tollie Eth, NP

## 2020-04-30 NOTE — Patient Instructions (Signed)
I recommend using an allergy medication such as Flonase (fluticasone) nasal spray to help with symptoms, as well as something like Claritin or Zyrtec daily.    Use warm or cool compresses (or both if they are relieving) to the eye several times a day.   If symptoms worsen or continue by Friday, please send me a mychart message to let me know. At that point we may need to start antibiotics.   Viral Respiratory Infection A respiratory infection is an illness that affects part of the respiratory system, such as the lungs, nose, or throat. A respiratory infection that is caused by a virus is called a viral respiratory infection. Common types of viral respiratory infections include:  A cold.  The flu (influenza).  A respiratory syncytial virus (RSV) infection. What are the causes? This condition is caused by a virus. What are the signs or symptoms? Symptoms of this condition include:  A stuffy or runny nose.  Yellow or green nasal discharge.  A cough.  Sneezing.  Fatigue.  Achy muscles.  A sore throat.  Sweating or chills.  A fever.  A headache. How is this diagnosed? This condition may be diagnosed based on:  Your symptoms.  A physical exam.  Testing of nasal swabs. How is this treated? This condition may be treated with medicines, such as:  Antiviral medicine. This may shorten the length of time a person has symptoms.  Expectorants. These make it easier to cough up mucus.  Decongestant nasal sprays.  Acetaminophen or NSAIDs to relieve fever and pain. Antibiotic medicines are not prescribed for viral infections. This is because antibiotics are designed to kill bacteria. They are not effective against viruses. Follow these instructions at home:  Managing pain and congestion  Take over-the-counter and prescription medicines only as told by your health care provider.  If you have a sore throat, gargle with a salt-water mixture 3-4 times a day or as needed. To  make a salt-water mixture, completely dissolve -1 tsp of salt in 1 cup of warm water.  Use nose drops made from salt water to ease congestion and soften raw skin around your nose.  Drink enough fluid to keep your urine pale yellow. This helps prevent dehydration and helps loosen up mucus. General instructions  Rest as much as possible.  Do not drink alcohol.  Do not use any products that contain nicotine or tobacco, such as cigarettes and e-cigarettes. If you need help quitting, ask your health care provider.  Keep all follow-up visits as told by your health care provider. This is important. How is this prevented?   Get an annual flu shot. You may get the flu shot in late summer, fall, or winter. Ask your health care provider when you should get your flu shot.  Avoid exposing others to your respiratory infection. ? Stay home from work or school as told by your health care provider. ? Wash your hands with soap and water often, especially after you cough or sneeze. If soap and water are not available, use alcohol-based hand sanitizer.  Avoid contact with people who are sick during cold and flu season. This is generally fall and winter. Contact a health care provider if:  Your symptoms last for 10 days or longer.  Your symptoms get worse over time.  You have a fever.  You have severe sinus pain in your face or forehead.  The glands in your jaw or neck become very swollen. Get help right away if you:  Feel  pain or pressure in your chest.  Have shortness of breath.  Faint or feel like you will faint.  Have severe and persistent vomiting.  Feel confused or disoriented. Summary  A respiratory infection is an illness that affects part of the respiratory system, such as the lungs, nose, or throat. A respiratory infection that is caused by a virus is called a viral respiratory infection.  Common types of viral respiratory infections are a cold, influenza, and respiratory  syncytial virus (RSV) infection.  Symptoms of this condition include a stuffy or runny nose, cough, sneezing, fatigue, achy muscles, sore throat, and fevers or chills.  Antibiotic medicines are not prescribed for viral infections. This is because antibiotics are designed to kill bacteria. They are not effective against viruses. This information is not intended to replace advice given to you by your health care provider. Make sure you discuss any questions you have with your health care provider. Document Revised: 09/22/2018 Document Reviewed: 10/25/2017 Elsevier Patient Education  2020 [employer].

## 2020-05-13 ENCOUNTER — Encounter: Payer: Self-pay | Admitting: Physician Assistant

## 2020-05-15 ENCOUNTER — Encounter: Payer: Self-pay | Admitting: Physician Assistant

## 2020-05-15 ENCOUNTER — Telehealth (INDEPENDENT_AMBULATORY_CARE_PROVIDER_SITE_OTHER): Payer: BLUE CROSS/BLUE SHIELD | Admitting: Physician Assistant

## 2020-05-15 DIAGNOSIS — F900 Attention-deficit hyperactivity disorder, predominantly inattentive type: Secondary | ICD-10-CM

## 2020-05-15 MED ORDER — LISDEXAMFETAMINE DIMESYLATE 60 MG PO CAPS: 60.0000 mg | ORAL_CAPSULE | ORAL | 0 refills | Status: DC

## 2020-05-15 NOTE — Progress Notes (Signed)
Patient ID: Haley Church, female   DOB: 1976-05-29, 44 y.o.   MRN: [id number] .Marland KitchenVirtual Visit via Video Note  I connected with Haley Church on 05/15/20 at  1:40 PM EDT by a video enabled telemedicine application and verified that I am speaking with the correct person using two identifiers.  Location: Patient: home Provider: clinic   I discussed the limitations of evaluation and management by telemedicine and the availability of in person appointments. The patient expressed understanding and agreed to proceed.  History of Present Illness:  Pt is a 44 yo female with ADHD who wants to restart vyvanse. She tried to do life without it. She hates the way adderall makes her feel. vyvanse is ok she just does not like the price. She is not completing task and feels very unproductive when not on medication. No problem with anxiety or depression.   .. Active Ambulatory Problems    Diagnosis Date Noted  . ADHD (attention deficit hyperactivity disorder) 10/06/2013  . Anxiety as acute reaction to exceptional stress 12/17/2014  . Postpartum care following cesarean delivery 02/04/2018  . S/P cesarean section - indication: Vasa Previa 5/10 02/05/2018  . Maternal anemia, with delivery 02/05/2018  . Granuloma of great toe 07/23/2018  . Bilateral plantar fasciitis 07/23/2018  . 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 Ingrown toenail of both feet 07/23/2018  . Overweight 11/11/2018  . Elevated blood pressure reading 11/11/2018  . 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 Amenorrhea 01/03/2020  . Arthralgia 01/03/2020   Resolved Ambulatory Problems    Diagnosis Date Noted  . Postpartum care following vaginal delivery (6/16) 03/14/2013  . Subacromial bursitis 10/03/2013  . Plantar fasciitis, right 10/31/2013  . Onychomycosis 11/02/2013  . Biceps tendinitis on left 11/06/2013  . Sebaceous cyst 05/15/2014  . Metatarsal fracture 10/10/2014  . Depression 09/03/2015  . History of miscarriage 12/10/2015  . Other fatigue  02/26/2016  . Missed periods 02/26/2016  . Moody 02/26/2016  . Gastritis 04/07/2016  . Acute maxillary sinusitis 08/27/2016  . Gastroenteritis 12/11/2016  . Anxiety state 06/30/2017  . Class 1 obesity due to excess calories without serious comorbidity with body mass index (BMI) of 30.0 to 30.9 in adult 06/23/2018   Past Medical History:  Diagnosis Date  . AMA (advanced maternal age) multigravida 35+   . Anxiety   . MTHFR mutation (HCC)    Reviewed med, allergy, problem list.     Observations/Objective: No acute distress Normal mood  Normal breathing.   .. Today's Vitals   05/15/20 1358  BP: 131/74  Pulse: 87   There is no height or weight on file to calculate BMI.    Assessment and Plan: Marland KitchenMarland KitchenDiagnoses and all orders for this visit:  Attention deficit hyperactivity disorder (ADHD), predominantly inattentive type -     lisdexamfetamine (VYVANSE) 60 MG capsule; Take 1 capsule (60 mg total) by mouth every morning. -     lisdexamfetamine (VYVANSE) 60 MG capsule; Take 1 capsule (60 mg total) by mouth every morning. -     lisdexamfetamine (VYVANSE) 60 MG capsule; Take 1 capsule (60 mg total) by mouth every morning.   Refilled for 3 months.  Discussed side effects.  Discussed mydayis new ADHD med she may like better. She will consider in future.  Follow up for future refills.    Follow Up Instructions:    I discussed the assessment and treatment plan with the patient. The patient was provided an opportunity to ask questions and all were answered. The patient agreed with the plan  and demonstrated an understanding of the instructions.   The patient was advised to call back or seek an in-person evaluation if the symptoms worsen or if the condition fails to improve as anticipated.  I provided 7  minutes of non-face-to-face time during this encounter.   Tandy Gaw, PA-C

## 2020-07-15 ENCOUNTER — Encounter: Payer: Self-pay | Admitting: Physician Assistant

## 2020-08-19 ENCOUNTER — Encounter: Payer: Self-pay | Admitting: Physician Assistant

## 2020-08-19 DIAGNOSIS — F900 Attention-deficit hyperactivity disorder, predominantly inattentive type: Secondary | ICD-10-CM

## 2020-08-19 MED ORDER — LISDEXAMFETAMINE DIMESYLATE 60 MG PO CAPS: 60.0000 mg | ORAL_CAPSULE | ORAL | 0 refills | Status: DC

## 2020-08-19 NOTE — Telephone Encounter (Signed)
See note I sent to patient.

## 2020-08-26 ENCOUNTER — Encounter: Payer: Self-pay | Admitting: Physician Assistant

## 2020-08-26 ENCOUNTER — Telehealth (INDEPENDENT_AMBULATORY_CARE_PROVIDER_SITE_OTHER): Payer: BLUE CROSS/BLUE SHIELD | Admitting: Physician Assistant

## 2020-08-26 VITALS — BP 133/88 | Ht 69.0 in | Wt 185.0 lb

## 2020-08-26 DIAGNOSIS — N912 Amenorrhea, unspecified: Secondary | ICD-10-CM

## 2020-08-26 DIAGNOSIS — Z13220 Encounter for screening for lipoid disorders: Secondary | ICD-10-CM | POA: Diagnosis not present

## 2020-08-26 DIAGNOSIS — Z131 Encounter for screening for diabetes mellitus: Secondary | ICD-10-CM

## 2020-08-26 DIAGNOSIS — M2550 Pain in unspecified joint: Secondary | ICD-10-CM

## 2020-08-26 DIAGNOSIS — F900 Attention-deficit hyperactivity disorder, predominantly inattentive type: Secondary | ICD-10-CM

## 2020-08-26 MED ORDER — LISDEXAMFETAMINE DIMESYLATE 60 MG PO CAPS: 60.0000 mg | ORAL_CAPSULE | ORAL | 0 refills | Status: DC

## 2020-08-26 NOTE — Progress Notes (Signed)
..Virtual Visit via Video Note  I connected with Haley Church on 08/26/20 at 11:10 AM EST by a video enabled telemedicine application and verified that I am speaking with the correct person using two identifiers.  Location: Patient: home Provider: clinic  Patient, myself and PA student, Haley Church present for visit.    I discussed the limitations of evaluation and management by telemedicine and the availability of in person appointments. The patient expressed understanding and agreed to proceed.  History of Present Illness: Pt is a 44 yo female with ADHD who needs refills.   She is doing well on vyvanse. No problems or concerns. Pt is sleeping well and productive. Pt denies any changes in headaches, vision changes or palpitations or anxiety.   Pt continues to not have a period since birth on her son over a year ago she wanted labs but not drawn yet. She continues to have intermittent arthralgias. Seems to be better recently. She wants labs reprinted.   Active Ambulatory Problems    Diagnosis Date Noted  . ADHD (attention deficit hyperactivity disorder) 10/06/2013  . Anxiety as acute reaction to exceptional stress 12/17/2014  . Postpartum care following cesarean delivery 02/04/2018  . S/P cesarean section - indication: Vasa Previa 5/10 02/05/2018  . Maternal anemia, with delivery 02/05/2018  . Granuloma of great toe 07/23/2018  . Bilateral plantar fasciitis 07/23/2018  . 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 Ingrown toenail of both feet 07/23/2018  . Overweight 11/11/2018  . Elevated blood pressure reading 11/11/2018  . 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 Amenorrhea 01/03/2020  . Arthralgia 01/03/2020   Resolved Ambulatory Problems    Diagnosis Date Noted  . Postpartum care following vaginal delivery (6/16) 03/14/2013  . Subacromial bursitis 10/03/2013  . Plantar fasciitis, right 10/31/2013  . Onychomycosis 11/02/2013  . Biceps tendinitis on left 11/06/2013  . Sebaceous cyst  05/15/2014  . Metatarsal fracture 10/10/2014  . Depression 09/03/2015  . History of miscarriage 12/10/2015  . Other fatigue 02/26/2016  . Missed periods 02/26/2016  . Moody 02/26/2016  . Gastritis 04/07/2016  . Acute maxillary sinusitis 08/27/2016  . Gastroenteritis 12/11/2016  . Anxiety state 06/30/2017  . Class 1 obesity due to excess calories without serious comorbidity with body mass index (BMI) of 30.0 to 30.9 in adult 06/23/2018   Past Medical History:  Diagnosis Date  . AMA (advanced maternal age) multigravida 35+   . Anxiety   . MTHFR mutation    Reviewed med, allergy, problem list.     Observations/Objective: No acute distress Normal mood and appearance Normal breathing  .[name] Today's Vitals   08/26/20 1056  BP: 133/88  Weight: 185 lb (83.9 kg)  Height: 5\' 9"  (1.753 m)   Body mass index is 27.32 kg/m.    Assessment and Plan:  Marland KitchenAmber was seen today for adhd.  Diagnoses and all orders for this visit:  Attention deficit hyperactivity disorder (ADHD), predominantly inattentive type -     lisdexamfetamine (VYVANSE) 60 MG capsule; Take 1 capsule (60 mg total) by mouth every morning. -     lisdexamfetamine (VYVANSE) 60 MG capsule; Take 1 capsule (60 mg total) by mouth every morning. -     lisdexamfetamine (VYVANSE) 60 MG capsule; Take 1 capsule (60 mg total) by mouth every morning.  Amenorrhea -     VITAMIN D 25 Hydroxy (Vit-D Deficiency, Fractures) -     FSH/LH -     Estradiol -     Lipid Panel w/reflex Direct LDL -  COMPLETE METABOLIC PANEL WITH GFR -     ANA -     Rheumatoid factor -     Cyclic citrul peptide antibody, IgG -     B12  Arthralgia, unspecified joint -     VITAMIN D 25 Hydroxy (Vit-D Deficiency, Fractures) -     FSH/LH -     Estradiol -     Lipid Panel w/reflex Direct LDL -     COMPLETE METABOLIC PANEL WITH GFR -     ANA -     Rheumatoid factor -     Cyclic citrul peptide antibody, IgG -     B12  Screening for lipid  disorders -     Lipid Panel w/reflex Direct LDL  Screening for diabetes mellitus -     COMPLETE METABOLIC PANEL WITH GFR   Not had labs drawn. Reprinted to have drawn and then will comment.   Refilled vyvanse. Follow up in 3 months.    Follow Up Instructions:    I discussed the assessment and treatment plan with the patient. The patient was provided an opportunity to ask questions and all were answered. The patient agreed with the plan and demonstrated an understanding of the instructions.   The patient was advised to call back or seek an in-person evaluation if the symptoms worsen or if the condition fails to improve as anticipated.   Tandy Gaw, PA-C

## 2020-10-21 ENCOUNTER — Encounter: Payer: Self-pay | Admitting: Physician Assistant

## 2020-10-23 ENCOUNTER — Telehealth: Payer: Self-pay | Admitting: *Deleted

## 2020-10-23 NOTE — Telephone Encounter (Signed)
Vyvanse approved 10/09/20-10/23/21. Pharmacy notified.

## 2021-04-02 ENCOUNTER — Encounter: Payer: Self-pay | Admitting: Physician Assistant

## 2021-04-08 ENCOUNTER — Telehealth: Payer: Self-pay | Admitting: *Deleted

## 2021-04-08 DIAGNOSIS — Z131 Encounter for screening for diabetes mellitus: Secondary | ICD-10-CM

## 2021-04-08 DIAGNOSIS — Z13220 Encounter for screening for lipoid disorders: Secondary | ICD-10-CM

## 2021-04-08 DIAGNOSIS — M2550 Pain in unspecified joint: Secondary | ICD-10-CM

## 2021-04-08 DIAGNOSIS — E663 Overweight: Secondary | ICD-10-CM

## 2021-04-08 NOTE — Telephone Encounter (Signed)
Labs re-ordered

## 2021-04-09 ENCOUNTER — Encounter: Payer: Self-pay | Admitting: Physician Assistant

## 2021-04-09 DIAGNOSIS — N951 Menopausal and female climacteric states: Secondary | ICD-10-CM | POA: Insufficient documentation

## 2021-04-09 NOTE — Progress Notes (Signed)
Hospital doctor,   Happy Early Birthday!  B12 looks great and in normal range. Stay on same dose of supplementation.  Vitamin D normal range. Take at least 2000 units a day of D3.  Rheumatoid panel is negative.  Kidney, liver, glucose, electrolytes look great.  FSH is 96 and estrogen is low. You are in menopause. You can expect many of the menopausal symptoms. We can try to treat the hardest ones to deal with. Hormone replacement is an option but it does come with risk.  You can certainly try herbal remedies.  Follow up to discuss if interested in medication.  HDL, good cholesterol, looks great.  LDL, bad cholesterol, not optimal under 100 but reasonable goal for current age and risk.

## 2021-04-11 LAB — COMPLETE METABOLIC PANEL WITH GFR
AG Ratio: 1.8 (calc) (ref 1.0–2.5)
ALT: 27 U/L (ref 6–29)
AST: 19 U/L (ref 10–35)
Albumin: 4.2 g/dL (ref 3.6–5.1)
Alkaline phosphatase (APISO): 113 U/L (ref 31–125)
BUN: 19 mg/dL (ref 7–25)
CO2: 30 mmol/L (ref 20–32)
Calcium: 9.3 mg/dL (ref 8.6–10.2)
Chloride: 104 mmol/L (ref 98–110)
Creat: 0.94 mg/dL (ref 0.50–0.99)
Globulin: 2.4 g/dL (calc) (ref 1.9–3.7)
Glucose, Bld: 89 mg/dL (ref 65–99)
Potassium: 4.8 mmol/L (ref 3.5–5.3)
Sodium: 140 mmol/L (ref 135–146)
Total Bilirubin: 0.5 mg/dL (ref 0.2–1.2)
Total Protein: 6.6 g/dL (ref 6.1–8.1)
eGFR: 76 mL/min/{1.73_m2} (ref >=60)

## 2021-04-11 LAB — LIPID PANEL W/REFLEX DIRECT LDL
Cholesterol: 231 mg/dL — ABNORMAL HIGH (ref <200)
HDL: 66 mg/dL (ref >=50)
LDL Cholesterol (Calc): 138 mg/dL (calc) — ABNORMAL HIGH
Non-HDL Cholesterol (Calc): 165 mg/dL (calc) — ABNORMAL HIGH (ref <130)
Total CHOL/HDL Ratio: 3.5 (calc) (ref <5.0)
Triglycerides: 143 mg/dL (ref <150)

## 2021-04-11 LAB — VITAMIN B12: Vitamin B-12: 581 pg/mL (ref 200–1100)

## 2021-04-11 LAB — CYCLIC CITRUL PEPTIDE ANTIBODY, IGG: Cyclic Citrullin Peptide Ab: <16 UNITS

## 2021-04-11 LAB — ANTI-NUCLEAR AB-TITER (ANA TITER): ANA Titer 1: 1:80 {titer} — ABNORMAL HIGH

## 2021-04-11 LAB — ANA: Anti Nuclear Antibody (ANA): POSITIVE — AB

## 2021-04-11 LAB — VITAMIN D 25 HYDROXY (VIT D DEFICIENCY, FRACTURES): Vit D, 25-Hydroxy: 38 ng/mL (ref 30–100)

## 2021-04-11 LAB — ESTRADIOL: Estradiol: <15 pg/mL

## 2021-04-11 LAB — FSH/LH
FSH: 96.3 m[IU]/mL
LH: 48.4 m[IU]/mL

## 2021-04-11 LAB — RHEUMATOID FACTOR: Rheumatoid fact SerPl-aCnc: <14 IU/mL (ref <14)

## 2021-04-12 ENCOUNTER — Encounter: Payer: Self-pay | Admitting: Physician Assistant

## 2021-04-12 DIAGNOSIS — Z780 Asymptomatic menopausal state: Secondary | ICD-10-CM | POA: Insufficient documentation

## 2021-04-12 NOTE — Progress Notes (Signed)
ANA is positive but very slightly and the pattern is usually seen in normal individuals and not ones with autoimmune disease. How is your arthralgia?

## 2021-04-16 ENCOUNTER — Other Ambulatory Visit: Payer: Self-pay | Admitting: Physician Assistant

## 2021-04-16 DIAGNOSIS — M2550 Pain in unspecified joint: Secondary | ICD-10-CM

## 2021-04-16 DIAGNOSIS — R768 Other specified abnormal immunological findings in serum: Secondary | ICD-10-CM

## 2021-04-16 NOTE — Progress Notes (Signed)
Referral made to rheumatology for evaluation of positive ANA and arthralgia.

## 2021-04-17 ENCOUNTER — Encounter: Payer: Self-pay | Admitting: Physician Assistant

## 2021-04-18 NOTE — Telephone Encounter (Signed)
I am confused for why she is confused.  She has an abnormal ANA which is a test that can indicate an autoimmune disease even though her level was a little borderline and [name] tried to reassure her I think she was still concerned and so Lesly Rubenstein offered to refer her to rheumatology who is the specialist who would deal with elevated ANAs.  Maybe she should schedule a follow-up appointment with Lesly Rubenstein to go over the details since it seems like there is a lot of confusion.

## 2021-05-20 NOTE — Progress Notes (Deleted)
Office Visit Note  Patient: Haley Church             Date of Birth: February 26, 1976           MRN: [id number]             PCP: Nolene Ebbs Referring: Nolene Ebbs Visit Date: 05/21/2021 Occupation: @GUAROCC @  Subjective:  No chief complaint on file.   History of Present Illness: Sivan Cuello is a 45 y.o. female here for joint pains and positive ANA.***   Labs reviewed ANA 1:80 DFS RF neg CCP neg FSH 96 menopausal range  Activities of Daily Living:  Patient reports morning stiffness for *** {minute/hour:19697}.   Patient {ACTIONS;DENIES/REPORTS:21021675::"Denies"} nocturnal pain.  Difficulty dressing/grooming: {ACTIONS;DENIES/REPORTS:21021675::"Denies"} Difficulty climbing stairs: {ACTIONS;DENIES/REPORTS:21021675::"Denies"} Difficulty getting out of chair: {ACTIONS;DENIES/REPORTS:21021675::"Denies"} Difficulty using hands for taps, buttons, cutlery, and/or writing: {ACTIONS;DENIES/REPORTS:21021675::"Denies"}  No Rheumatology ROS completed.   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 Past Surgical History:  Procedure  Laterality Date   CESAREAN SECTION WITH BILATERAL TUBAL LIGATION N/A 02/04/2018   Procedure: CESAREAN SECTION WITH BILATERAL TUBAL LIGATION;  Surgeon: 04/06/2018, MD;  Location: [facility];  Service: Obstetrics;  Laterality: N/A;   DILATION AND EVACUATION N/A 04/09/2015   Procedure: DILATATION AND EVACUATION with chromosome analysis;  Surgeon: 06/10/2015, MD;  Location: [facility];  Service: Gynecology;  Laterality: N/A;   DILATION AND EVACUATION N/A 01/05/2016   Procedure: DILATATION AND EVACUATION;  Surgeon: 03/06/2016, MD;  Location: [facility];  Service: Gynecology;  Laterality: N/A;   FOOT SURGERY     LAPAROSCOPIC ABDOMINAL EXPLORATION     OVARIAN CYST REMOVAL     Social History   Social History Narrative   Not on file   Immunization History  Administered Date(s) Administered   Moderna Sars-Covid-2 Vaccination 02/16/2020, 03/09/2020   Tdap 03/14/2013, 02/06/2018     Objective: Vital Signs: There were no vitals taken for this visit.   Physical Exam   Musculoskeletal Exam: ***  CDAI Exam: CDAI Score: -- Patient Global: --; Provider Global: -- Swollen: --; Tender: -- Joint Exam 05/21/2021   No joint exam has been documented for this visit   There is currently no information documented on the homunculus. Go to the Rheumatology activity and complete the homunculus joint exam.  Investigation: No additional findings.  Imaging: No results found.  Recent Labs: Lab Results  Component Value Date   WBC 19.4 (H) 02/05/2018   HGB 9.5 (L) 02/05/2018   PLT 216 02/05/2018   NA 140 04/08/2021   K 4.8 04/08/2021   CL 104 04/08/2021   CO2 30 04/08/2021  GLUCOSE 89 04/08/2021   BUN 19 04/08/2021   CREATININE 0.94 04/08/2021   BILITOT 0.5 04/08/2021   ALKPHOS 134 (H) 03/14/2013   AST 19 04/08/2021   ALT 27 04/08/2021   PROT 6.6 04/08/2021   ALBUMIN 2.0 (L) 03/14/2013   CALCIUM 9.3 04/08/2021   GFRAA >90 03/14/2013    Speciality Comments: No  specialty comments available.  Procedures:  No procedures performed Allergies: Percocet [oxycodone-acetaminophen], Sulfa antibiotics, and Vicodin [hydrocodone-acetaminophen]   Assessment / Plan:     Visit Diagnoses: No diagnosis found.  Orders: No orders of the defined types were placed in this encounter.  No orders of the defined types were placed in this encounter.   Face-to-face time spent with patient was *** minutes. Greater than 50% of time was spent in counseling and coordination of care.  Follow-Up Instructions: No follow-ups on file.   Fuller Plan, MD  Note - This record has been created using [employer].  Chart creation errors have been sought, but may not always  have been located. Such creation errors do not reflect on  the standard of medical care.

## 2021-05-21 ENCOUNTER — Ambulatory Visit: Payer: 59 | Admitting: Internal Medicine

## 2021-05-23 ENCOUNTER — Other Ambulatory Visit: Payer: Self-pay

## 2021-05-23 ENCOUNTER — Encounter: Payer: Self-pay | Admitting: Physician Assistant

## 2021-05-23 ENCOUNTER — Ambulatory Visit (INDEPENDENT_AMBULATORY_CARE_PROVIDER_SITE_OTHER): Payer: 59 | Admitting: Physician Assistant

## 2021-05-23 VITALS — BP 133/82 | HR 89 | Ht 69.0 in | Wt 207.0 lb

## 2021-05-23 DIAGNOSIS — Z6830 Body mass index (BMI) 30.0-30.9, adult: Secondary | ICD-10-CM

## 2021-05-23 DIAGNOSIS — F900 Attention-deficit hyperactivity disorder, predominantly inattentive type: Secondary | ICD-10-CM

## 2021-05-23 DIAGNOSIS — Z6832 Body mass index (BMI) 32.0-32.9, adult: Secondary | ICD-10-CM | POA: Insufficient documentation

## 2021-05-23 DIAGNOSIS — E6609 Other obesity due to excess calories: Secondary | ICD-10-CM

## 2021-05-23 DIAGNOSIS — R635 Abnormal weight gain: Secondary | ICD-10-CM | POA: Diagnosis not present

## 2021-05-23 MED ORDER — COTEMPLA XR-ODT 17.3 MG PO TBED: EXTENDED_RELEASE_TABLET | ORAL | 0 refills | Status: DC

## 2021-05-23 NOTE — Progress Notes (Signed)
Subjective:    Patient ID: Haley Church, female    DOB: [birth year], 45 y.o.   MRN: [id number]  HPI Pt is a 45 yo post menopausal obese woman with ADHD who presents to the clinic to discuss weight and medication help.   Pt is very active and does beach body work outs 2-4 times a week. She does admit to having problems with cravings and sweets. She has never struggled this much with weight. She believes it has something to do with menopause. Her menopausal symptoms are fairly well controlled with herbal remedies. She is not on vyvanse due to cost. She can tell with her motavation, focus and much more that not treated ADHD is effecting her weight.   .. 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   Amenorrhea 01/03/2020   Arthralgia 01/03/2020   Menopause syndrome 04/09/2021   Post-menopausal 04/12/2021   Class 1 obesity due to excess calories without serious comorbidity with body mass index (BMI) of 30.0 to 30.9 in adult 05/23/2021   Abnormal weight gain 05/23/2021   Resolved Ambulatory Problems    Diagnosis Date Noted   Postpartum care following vaginal delivery (6/16) 03/14/2013   Subacromial bursitis 10/03/2013   Plantar fasciitis, right 10/31/2013   Onychomycosis 11/02/2013   Biceps tendinitis on left 11/06/2013   Sebaceous cyst 05/15/2014   Metatarsal fracture 10/10/2014   Depression 09/03/2015    History of miscarriage 12/10/2015   Other fatigue 02/26/2016   Missed periods 02/26/2016   Moody 02/26/2016   Gastritis 04/07/2016   Acute maxillary sinusitis 08/27/2016   Gastroenteritis 12/11/2016   Anxiety state 06/30/2017   Class 1 obesity due to excess calories without serious comorbidity with body mass index (BMI) of 30.0 to 30.9 in adult 06/23/2018   Past Medical History:  Diagnosis Date   AMA (advanced maternal age) multigravida 35+    Anxiety    MTHFR mutation       Review of Systems See HPi.     Objective:   Physical Exam Vitals reviewed.  Constitutional:      Appearance: Normal appearance. She is obese.  HENT:     Head: Normocephalic.  Cardiovascular:     Rate and Rhythm: Normal rate.  Pulmonary:     Effort: Pulmonary effort is normal.  Neurological:     General: No focal deficit present.     Mental Status: She is alert.  Psychiatric:        Mood and Affect: Mood normal.       Assessment & Plan:  Marland KitchenMarland KitchenAmber was seen today for obesity.  Diagnoses and all orders for this visit:  Class 1 obesity due to excess calories without serious comorbidity with body mass index (BMI) of 30.0 to 30.9 in adult  Attention deficit hyperactivity disorder (ADHD), predominantly inattentive type -     Methylphenidate (COTEMPLA XR-ODT) 17.3 MG TBED; Take one tablet daily.  Abnormal weight gain  Will try cotempla for ADHD. Sent to [facility].  Let me know if having trouble getting it or not effective.   Discussed weight.  [name].Discussed low carb diet with 1500 calories and 80g of protein.  Exercising at least 150 minutes a week.  My Fitness Pal could be a Chief Technology Officer.  Contrave/qsymia/saxenda/wegovy/mournjaro all discussed.  If on cotempla phentermine is not option.  Pt will research and will get back to me.   Spent 30 minutes with patient discussing weight and medication options.

## 2021-05-23 NOTE — Patient Instructions (Addendum)
Contrave pill twice a day.   Qsymia pill once a day.   Saxenda injection daily.   Reginal Lutes- out of stock once a week injection  Mounjaro- coupon once a week injection.

## 2021-05-26 NOTE — Telephone Encounter (Signed)
Patient also left a vm about this.  

## 2021-06-22 ENCOUNTER — Encounter: Payer: Self-pay | Admitting: Physician Assistant

## 2021-06-23 MED ORDER — COTEMPLA XR-ODT 25.9 MG PO TBED: 1.0000 | EXTENDED_RELEASE_TABLET | Freq: Every day | ORAL | 0 refills | Status: DC

## 2021-07-20 ENCOUNTER — Encounter: Payer: Self-pay | Admitting: Physician Assistant

## 2021-07-21 ENCOUNTER — Other Ambulatory Visit: Payer: Self-pay | Admitting: Physician Assistant

## 2021-07-21 MED ORDER — COTEMPLA XR-ODT 25.9 MG PO TBED: 1.0000 | EXTENDED_RELEASE_TABLET | Freq: Every day | ORAL | 0 refills | Status: DC

## 2021-07-21 NOTE — Progress Notes (Signed)
Refills sent

## 2021-08-29 ENCOUNTER — Encounter: Payer: Self-pay | Admitting: Physician Assistant

## 2021-08-29 MED ORDER — COTEMPLA XR-ODT 25.9 MG PO TBED: 1.0000 | EXTENDED_RELEASE_TABLET | Freq: Every day | ORAL | 0 refills | Status: DC

## 2021-09-01 MED ORDER — COTEMPLA XR-ODT 25.9 MG PO TBED: 1.0000 | EXTENDED_RELEASE_TABLET | Freq: Every day | ORAL | 0 refills | Status: DC

## 2021-09-01 NOTE — Telephone Encounter (Signed)
Verified with Kim at [facility] that RX from 08/29/2021 was not picked up.  I cancelled this RX.  Please resend to [facility] in [place].  Tiajuana Amass, CMA

## 2021-09-01 NOTE — Addendum Note (Signed)
Addended by: Stan Head on: 09/01/2021 01:32 PM   Modules accepted: Orders

## 2021-09-04 NOTE — Telephone Encounter (Signed)
Prescription was supposed to be sent to [facility], not [facility].  On 09/01/2021 it was sent to [facility].  Tiajuana Amass, CMA

## 2021-09-04 NOTE — Addendum Note (Signed)
Addended by: Stan Head on: 09/04/2021 01:20 PM   Modules accepted: Orders

## 2021-09-05 MED ORDER — COTEMPLA XR-ODT 25.9 MG PO TBED: 1.0000 | EXTENDED_RELEASE_TABLET | Freq: Every day | ORAL | 0 refills | Status: DC

## 2021-10-17 ENCOUNTER — Encounter: Payer: Self-pay | Admitting: Physician Assistant

## 2021-10-17 MED ORDER — COTEMPLA XR-ODT 25.9 MG PO TBED: 1.0000 | EXTENDED_RELEASE_TABLET | Freq: Every day | ORAL | 0 refills | Status: DC

## 2021-10-27 ENCOUNTER — Ambulatory Visit (INDEPENDENT_AMBULATORY_CARE_PROVIDER_SITE_OTHER): Payer: 59 | Admitting: Physician Assistant

## 2021-10-27 ENCOUNTER — Encounter: Payer: Self-pay | Admitting: Physician Assistant

## 2021-10-27 ENCOUNTER — Other Ambulatory Visit: Payer: Self-pay

## 2021-10-27 VITALS — BP 130/78 | HR 85 | Ht 69.0 in | Wt 219.0 lb

## 2021-10-27 DIAGNOSIS — Z6832 Body mass index (BMI) 32.0-32.9, adult: Secondary | ICD-10-CM | POA: Diagnosis not present

## 2021-10-27 DIAGNOSIS — E66811 Obesity, class 1: Secondary | ICD-10-CM

## 2021-10-27 DIAGNOSIS — F900 Attention-deficit hyperactivity disorder, predominantly inattentive type: Secondary | ICD-10-CM | POA: Diagnosis not present

## 2021-10-27 DIAGNOSIS — E6609 Other obesity due to excess calories: Secondary | ICD-10-CM

## 2021-10-27 MED ORDER — COTEMPLA XR-ODT 25.9 MG PO TBED: 1.0000 | EXTENDED_RELEASE_TABLET | Freq: Every day | ORAL | 0 refills | Status: DC

## 2021-10-27 NOTE — Progress Notes (Signed)
Subjective:    Patient ID: Haley Church, female    DOB: 23-Jan-1976, 46 y.o.   MRN: [id number]  HPI Pt is a 46 yo obese female with ADHD who presents to the clinic for medication refills.   She is doing well with cotempla and likely it a lot. No side effects. No increase in anxiety. No problems sleeping.   She is frustrated with her weight. She does exercise but admits to not being perfect with her diet. She wants to try something to help with weight loss.   .. 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   Amenorrhea 01/03/2020   Arthralgia 01/03/2020   Menopause syndrome 04/09/2021   Post-menopausal 04/12/2021   Class 1 obesity due to excess calories without serious comorbidity with body mass index (BMI) of 32.0 to 32.9 in adult 05/23/2021   Abnormal weight gain 05/23/2021   Resolved Ambulatory Problems    Diagnosis Date Noted   Postpartum care following vaginal delivery (6/16) 03/14/2013   Subacromial bursitis 10/03/2013   Plantar fasciitis, right 10/31/2013   Onychomycosis 11/02/2013   Biceps tendinitis on left 11/06/2013   Sebaceous cyst 05/15/2014   Metatarsal fracture 10/10/2014   Depression 09/03/2015   History of miscarriage 12/10/2015   Other fatigue 02/26/2016   Missed periods 02/26/2016   Moody 02/26/2016   Gastritis 04/07/2016   Acute maxillary sinusitis 08/27/2016   Gastroenteritis  12/11/2016   Anxiety state 06/30/2017   Class 1 obesity due to excess calories without serious comorbidity with body mass index (BMI) of 30.0 to 30.9 in adult 06/23/2018   Past Medical History:  Diagnosis Date   AMA (advanced maternal age) multigravida 35+    Anxiety    MTHFR mutation       Review of Systems  All other systems reviewed and are negative.     Objective:   Physical Exam Vitals reviewed.  Constitutional:      Appearance: Normal appearance.  HENT:     Head: Normocephalic.  Cardiovascular:     Rate and Rhythm: Normal rate and regular rhythm.     Pulses: Normal pulses.     Heart sounds: Normal heart sounds.  Pulmonary:     Effort: Pulmonary effort is normal.     Breath sounds: Normal breath sounds.  Neurological:     General: No focal deficit present.     Mental Status: She is alert.  Psychiatric:        Mood and Affect: Mood normal.          Assessment & Plan:   Marland KitchenMarland KitchenAmber was seen today for adhd.  Diagnoses and all orders for this visit:  Attention deficit hyperactivity disorder (ADHD), predominantly inattentive type -     Methylphenidate (COTEMPLA XR-ODT) 25.9 MG TBED; Take 1 tablet by mouth daily. -     Methylphenidate (COTEMPLA XR-ODT) 25.9 MG TBED; Take 1 tablet by mouth daily. -     Methylphenidate (COTEMPLA XR-ODT) 25.9 MG TBED; Take 1  tablet by mouth daily.  Class 1 obesity due to excess calories without serious comorbidity with body mass index (BMI) of 32.0 to 32.9 in adult -     Semaglutide-Weight Management (WEGOVY) 0.25 MG/0.5ML SOAJ; Inject 0.25 mg into the skin once a week. For 4 weeks then increase to the next higher dose. -     WEGOVY 0.5 MG/0.5ML SOAJ; Inject 0.5 mg into the skin once a week. Use this dose for 1 month (4 shots) and then increase to next higher dose. -     WEGOVY 1 MG/0.5ML SOAJ; Inject 1 mg into the skin once a week. Use this dose for 1 month (4 shots) and then increase to next higher dose.   Refilled cotempla.   Follow up in 6 months.   [name].Discussed low carb diet with 1500 calories and 80g of protein.  Exercising at least 150 minutes a week.  My Fitness Pal could be a Microbiologist.  Discussed wegovy and side effects. Reminded of titration Sign up for coupon card Follow up in 3 months.

## 2021-10-27 NOTE — Patient Instructions (Signed)
ZOXWRU   Semaglutide Injection (Weight Management) What is this medication? SEMAGLUTIDE (SEM a GLOO tide) promotes weight loss. It may also be used to maintain weight loss. It works by decreasing appetite. Changes to diet and exercise are often combined with this medication. This medicine may be used for other purposes; ask your health care provider or pharmacist if you have questions. COMMON BRAND NAME(S): EAVWUJ What should I tell my care team before I take this medication? They need to know if you have any of these conditions: Endocrine tumors (MEN 2) or if someone in your family had these tumors Eye disease, vision problems Gallbladder disease History of depression or mental health disease History of pancreatitis Kidney disease Stomach or intestine problems Suicidal thoughts, plans, or attempt; a previous suicide attempt by you or a family member Thyroid cancer or if someone in your family had thyroid cancer An unusual or allergic reaction to semaglutide, other medications, foods, dyes, or preservatives Pregnant or trying to get pregnant Breast-feeding How should I use this medication? This medication is injected under the skin. You will be taught how to prepare and give it. Take it as directed on the prescription label. It is given once every week (every 7 days). Keep taking it unless your care team tells you to stop. It is important that you put your used needles and pens in a special sharps container. Do not put them in a trash can. If you do not have a sharps container, call your pharmacist or care team to get one. A special MedGuide will be given to you by the pharmacist with each prescription and refill. Be sure to read this information carefully each time. This medication comes with INSTRUCTIONS FOR USE. Ask your pharmacist for directions on how to use this medication. Read the information carefully. Talk to your pharmacist or care team if you have questions. Talk to your care  team about the use of this medication in children. Special care may be needed. Overdosage: If you think you have taken too much of this medicine contact a poison control center or emergency room at once. NOTE: This medicine is only for you. Do not share this medicine with others. What if I miss a dose? If you miss a dose and the next scheduled dose is more than 2 days away, take the missed dose as soon as possible. If you miss a dose and the next scheduled dose is less than 2 days away, do not take the missed dose. Take the next dose at your regular time. Do not take double or extra doses. If you miss your dose for 2 weeks or more, take the next dose at your regular time or call your care team to talk about how to restart this medication. What may interact with this medication? Insulin and other medications for diabetes This list may not describe all possible interactions. Give your health care provider a list of all the medicines, herbs, non-prescription drugs, or dietary supplements you use. Also tell them if you smoke, drink alcohol, or use illegal drugs. Some items may interact with your medicine. What should I watch for while using this medication? Visit your care team for regular checks on your progress. It may be some time before you see the benefit from this medication. Drink plenty of fluids while taking this medication. Check with your care team if you have severe diarrhea, nausea, and vomiting, or if you sweat a lot. The loss of too much body fluid may make  it dangerous for you to take this medication. This medication may affect blood sugar levels. Ask your care team if changes in diet or medications are needed if you have diabetes. If you or your family notice any changes in your behavior, such as new or worsening depression, thoughts of harming yourself, anxiety, other unusual or disturbing thoughts, or memory loss, call your care team right away. Women should inform their care team if they  wish to become pregnant or think they might be pregnant. Losing weight while pregnant is not advised and may cause harm to the unborn child. Talk to your care team for more information. What side effects may I notice from receiving this medication? Side effects that you should report to your care team as soon as possible: Allergic reactions--skin rash, itching, hives, swelling of the face, lips, tongue, or throat Change in vision Dehydration--increased thirst, dry mouth, feeling faint or lightheaded, headache, dark yellow or brown urine Gallbladder problems--severe stomach pain, nausea, vomiting, fever Heart palpitations--rapid, pounding, or irregular heartbeat Kidney injury--decrease in the amount of urine, swelling of the ankles, hands, or feet Pancreatitis--severe stomach pain that spreads to your back or gets worse after eating or when touched, fever, nausea, vomiting Thoughts of suicide or self-harm, worsening mood, feelings of depression Thyroid cancer--new mass or lump in the neck, pain or trouble swallowing, trouble breathing, hoarseness Side effects that usually do not require medical attention (report to your care team if they continue or are bothersome): Diarrhea Loss of appetite Nausea Stomach pain Vomiting This list may not describe all possible side effects. Call your doctor for medical advice about side effects. You may report side effects to FDA at 1-800-FDA-1088. Where should I keep my medication? Keep out of the reach of children and pets. Refrigeration (preferred): Store in the refrigerator. Do not freeze. Keep this medication in the original container until you are ready to take it. Get rid of any unused medication after the expiration date. Room temperature: If needed, prior to cap removal, the pen can be stored at room temperature for up to 28 days. Protect from light. If it is stored at room temperature, get rid of any unused medication after 28 days or after it expires,  whichever is first. It is important to get rid of the medication as soon as you no longer need it or it is expired. You can do this in two ways: Take the medication to a medication take-back program. Check with your pharmacy or law enforcement to find a location. If you cannot return the medication, follow the directions in the Pink. NOTE: This sheet is a summary. It may not cover all possible information. If you have questions about this medicine, talk to your doctor, pharmacist, or health care provider.  2022 Elsevier/Gold Standard (2020-12-20 00:00:00)

## 2021-10-28 ENCOUNTER — Encounter: Payer: Self-pay | Admitting: Physician Assistant

## 2021-10-28 MED ORDER — WEGOVY 1 MG/0.5ML ~~LOC~~ SOAJ: 1.0000 mg | SUBCUTANEOUS | 0 refills | Status: DC

## 2021-10-28 MED ORDER — WEGOVY 0.25 MG/0.5ML ~~LOC~~ SOAJ: 0.2500 mg | SUBCUTANEOUS | 0 refills | Status: DC

## 2021-10-28 MED ORDER — WEGOVY 0.5 MG/0.5ML ~~LOC~~ SOAJ: 0.5000 mg | SUBCUTANEOUS | 0 refills | Status: DC

## 2022-01-21 ENCOUNTER — Encounter: Payer: Self-pay | Admitting: Physician Assistant

## 2022-01-21 MED ORDER — AMPHETAMINE-DEXTROAMPHETAMINE 10 MG PO TABS: 10.0000 mg | ORAL_TABLET | Freq: Two times a day (BID) | ORAL | 0 refills | Status: DC

## 2022-02-24 ENCOUNTER — Encounter: Payer: Self-pay | Admitting: Physician Assistant

## 2022-02-24 ENCOUNTER — Other Ambulatory Visit: Payer: Self-pay | Admitting: Physician Assistant

## 2022-02-24 MED ORDER — AMPHETAMINE-DEXTROAMPHETAMINE 10 MG PO TABS: 10.0000 mg | ORAL_TABLET | Freq: Two times a day (BID) | ORAL | 0 refills | Status: DC

## 2022-03-04 ENCOUNTER — Telehealth (INDEPENDENT_AMBULATORY_CARE_PROVIDER_SITE_OTHER): Payer: Self-pay | Admitting: Physician Assistant

## 2022-03-04 ENCOUNTER — Encounter: Payer: Self-pay | Admitting: Physician Assistant

## 2022-03-04 DIAGNOSIS — Z91199 Patient's noncompliance with other medical treatment and regimen due to unspecified reason: Secondary | ICD-10-CM

## 2022-03-04 NOTE — Progress Notes (Signed)
LM 11:39. Sent direct link twice.

## 2022-03-26 ENCOUNTER — Telehealth (INDEPENDENT_AMBULATORY_CARE_PROVIDER_SITE_OTHER): Payer: Self-pay | Admitting: Physician Assistant

## 2022-03-26 ENCOUNTER — Encounter: Payer: Self-pay | Admitting: Physician Assistant

## 2022-03-26 VITALS — BP 126/78 | HR 94

## 2022-03-26 DIAGNOSIS — F900 Attention-deficit hyperactivity disorder, predominantly inattentive type: Secondary | ICD-10-CM

## 2022-03-26 MED ORDER — AMPHETAMINE-DEXTROAMPHETAMINE 20 MG PO TABS: 20.0000 mg | ORAL_TABLET | Freq: Two times a day (BID) | ORAL | 0 refills | Status: DC

## 2022-03-26 NOTE — Progress Notes (Signed)
..Virtual Visit via Video Note  I connected with Haley Church on 03/26/22 at 10:10 AM EDT by a video enabled telemedicine application and verified that I am speaking with the correct person using two identifiers.  Location: Patient: home Provider: clinic  .Marland KitchenParticipating in visit:  Patient: Hospital doctor Provider: Tandy Gaw PA-C   I discussed the limitations of evaluation and management by telemedicine and the availability of in person appointments. The patient expressed understanding and agreed to proceed.  History of Present Illness: Pt is a 46 yo female with ADHD who needs medication refills. She is doing ok on adderall 10mg  bid but feels like she needs it increased a little. She is working now as a . She has hours in the evenings that she needs to stay focused for. No increase in anxiety. Her mood is great. No headaches and patient is sleeping well.   .. 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   Amenorrhea 01/03/2020   Arthralgia 01/03/2020   Menopause syndrome 04/09/2021   Post-menopausal 04/12/2021   Class 1 obesity due to excess calories without serious comorbidity with body mass index (BMI) of 32.0 to 32.9 in adult 05/23/2021   Abnormal weight gain 05/23/2021   Resolved Ambulatory Problems    Diagnosis Date Noted   Postpartum care following vaginal delivery (6/16) 03/14/2013    Subacromial bursitis 10/03/2013   Plantar fasciitis, right 10/31/2013   Onychomycosis 11/02/2013   Biceps tendinitis on left 11/06/2013   Sebaceous cyst 05/15/2014   Metatarsal fracture 10/10/2014   Depression 09/03/2015   History of miscarriage 12/10/2015   Other fatigue 02/26/2016   Missed periods 02/26/2016   Moody 02/26/2016   Gastritis 04/07/2016   Acute maxillary sinusitis 08/27/2016   Gastroenteritis 12/11/2016   Anxiety state 06/30/2017   Class 1 obesity due to excess calories without serious comorbidity with body mass index (BMI) of 30.0 to 30.9 in adult 06/23/2018   Past Medical History:  Diagnosis Date   AMA (advanced maternal age) multigravida 35+    Anxiety    MTHFR mutation        Observations/Objective: No acute distress Normal mood and appearance  .06/25/2018 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   Amenorrhea 01/03/2020   Arthralgia 01/03/2020   Menopause syndrome 04/09/2021   Post-menopausal 04/12/2021   Class 1 obesity due to excess calories without serious comorbidity with body mass index (BMI) of 32.0 to 32.9 in adult 05/23/2021  Abnormal weight gain 05/23/2021   Resolved Ambulatory Problems    Diagnosis Date Noted   Postpartum care following vaginal delivery (6/16) 03/14/2013   Subacromial bursitis 10/03/2013   Plantar fasciitis, right 10/31/2013   Onychomycosis  11/02/2013   Biceps tendinitis on left 11/06/2013   Sebaceous cyst 05/15/2014   Metatarsal fracture 10/10/2014   Depression 09/03/2015   History of miscarriage 12/10/2015   Other fatigue 02/26/2016   Missed periods 02/26/2016   Moody 02/26/2016   Gastritis 04/07/2016   Acute maxillary sinusitis 08/27/2016   Gastroenteritis 12/11/2016   Anxiety state 06/30/2017   Class 1 obesity due to excess calories without serious comorbidity with body mass index (BMI) of 30.0 to 30.9 in adult 06/23/2018   Past Medical History:  Diagnosis Date   AMA (advanced maternal age) multigravida 35+    Anxiety    MTHFR mutation       Assessment and Plan: Marland KitchenMarland KitchenAmber was seen today for follow-up.  Diagnoses and all orders for this visit:  Attention deficit hyperactivity disorder (ADHD), predominantly inattentive type -     amphetamine-dextroamphetamine (ADDERALL) 20 MG tablet; Take 1 tablet (20 mg total) by mouth 2 (two) times daily. -     amphetamine-dextroamphetamine (ADDERALL) 20 MG tablet; Take 1 tablet (20 mg total) by mouth 2 (two) times daily. -     amphetamine-dextroamphetamine (ADDERALL) 20 MG tablet; Take 1 tablet (20 mg total) by mouth 2 (two) times daily.    Increased Adderall to 20mg  bid Pt doing well Mood great Follow up in 3 months  Follow Up Instructions:    I discussed the assessment and treatment plan with the patient. The patient was provided an opportunity to ask questions and all were answered. The patient agreed with the plan and demonstrated an understanding of the instructions.   The patient was advised to call back or seek an in-person evaluation if the symptoms worsen or if the condition fails to improve as anticipated.    , PA-C

## 2022-03-26 NOTE — Progress Notes (Signed)
Pt would like refill fof addaerall

## 2022-07-31 ENCOUNTER — Ambulatory Visit (INDEPENDENT_AMBULATORY_CARE_PROVIDER_SITE_OTHER): Payer: Self-pay | Admitting: Physician Assistant

## 2022-07-31 ENCOUNTER — Encounter: Payer: Self-pay | Admitting: Physician Assistant

## 2022-07-31 VITALS — BP 148/95 | HR 98 | Ht 69.0 in | Wt 206.0 lb

## 2022-07-31 DIAGNOSIS — R030 Elevated blood-pressure reading, without diagnosis of hypertension: Secondary | ICD-10-CM

## 2022-07-31 DIAGNOSIS — F900 Attention-deficit hyperactivity disorder, predominantly inattentive type: Secondary | ICD-10-CM

## 2022-07-31 DIAGNOSIS — J209 Acute bronchitis, unspecified: Secondary | ICD-10-CM

## 2022-07-31 MED ORDER — AMPHETAMINE-DEXTROAMPHETAMINE 20 MG PO TABS
20.0000 mg | ORAL_TABLET | Freq: Three times a day (TID) | ORAL | 0 refills | Status: DC
Start: 2022-08-29 — End: 2022-12-21

## 2022-07-31 MED ORDER — AMPHETAMINE-DEXTROAMPHETAMINE 20 MG PO TABS: 20.0000 mg | ORAL_TABLET | Freq: Three times a day (TID) | ORAL | 0 refills | Status: DC

## 2022-07-31 MED ORDER — METHYLPREDNISOLONE 4 MG PO TBPK: ORAL_TABLET | ORAL | 0 refills | Status: DC

## 2022-07-31 NOTE — Patient Instructions (Signed)
  Acute Bronchitis, Adult  Acute bronchitis is when air tubes in the lungs (bronchi) suddenly get swollen. The condition can make it hard for you to breathe. In adults, acute bronchitis usually goes away within 2 weeks. A cough caused by bronchitis may last up to 3 weeks. Smoking, allergies, and asthma can make the condition worse. What are the causes? Germs that cause cold and flu (viruses). The most common cause of this condition is the virus that causes the common cold. Bacteria. Substances that bother (irritate) the lungs, including: Smoke from cigarettes and other types of tobacco. Dust and pollen. Fumes from chemicals, gases, or burned fuel. Indoor or outdoor air pollution. What increases the risk? A weak body's defense system. This is also called the immune system. Any condition that affects your lungs and breathing, such as asthma. What are the signs or symptoms? A cough. Coughing up clear, yellow, or green mucus. Making high-pitched whistling sounds when you breathe, most often when you breathe out (wheezing). Runny or stuffy nose. Having too much mucus in your lungs (chest congestion). Shortness of breath. Body aches. A sore throat. How is this treated? Acute bronchitis may go away over time without treatment. Your doctor may tell you to: Drink more fluids. This will help thin your mucus so it is easier to cough up. Use a device that gets medicine into your lungs (inhaler). Use a vaporizer or a humidifier. These are machines that add water to the air. This helps with coughing and poor breathing. Take a medicine that thins mucus and helps clear it from your lungs. Take a medicine that prevents or stops coughing. It is not common to take an antibiotic medicine for this condition. Follow these instructions at home:  Take over-the-counter and prescription medicines only as told by your doctor. Use an inhaler, vaporizer, or humidifier as told by your doctor. Take two  teaspoons (10 mL) of honey at bedtime. This helps lessen your coughing at night. Drink enough fluid to keep your pee (urine) pale yellow. Do not smoke or use any products that contain nicotine or tobacco. If you need help quitting, ask your doctor. Get a lot of rest. Return to your normal activities when your doctor says that it is safe. Keep all follow-up visits. How is this prevented?  Wash your hands often with soap and water for at least 20 seconds. If you cannot use soap and water, use hand sanitizer. Avoid contact with people who have cold symptoms. Try not to touch your mouth, nose, or eyes with your hands. Avoid breathing in smoke or chemical fumes. Make sure to get the flu shot every year. Contact a doctor if: Your symptoms do not get better in 2 weeks. You have trouble coughing up the mucus. Your cough keeps you awake at night. You have a fever. Get help right away if: You cough up blood. You have chest pain. You have very bad shortness of breath. You faint or keep feeling like you are going to faint. You have a very bad headache. Your fever or chills get worse. These symptoms may be an emergency. Get help right away. Call your local emergency services (911 in the [place] Do not wait to see if the symptoms will go away. Do not drive yourself to the hospital. Summary Acute bronchitis is when air tubes in the lungs (bronchi) suddenly get swollen. In adults, acute bronchitis usually goes away within 2 weeks. Drink more fluids. This will help thin your mucus so it   is easier to cough up. Take over-the-counter and prescription medicines only as told by your doctor. Contact a doctor if your symptoms do not improve after 2 weeks of treatment. This information is not intended to replace advice given to you by your health care provider. Make sure you discuss any questions you have with your health care provider. Document Revised: 01/15/2021 Document Reviewed: 01/15/2021 Elsevier  Patient Education  2023 Else[employer] Inc.  

## 2022-07-31 NOTE — Progress Notes (Signed)
Established Patient Office Visit  Subjective   Patient ID: Haley Church, female    DOB: May 03, 1976  Age: 46 y.o. MRN: [id number]  Chief Complaint  Patient presents with   ADHD    HPI Pt is a 46 yo female with ADHD who presents to the clinic for medication refills.   She feels like she needs a little increase in adderall. She is taking 1 and 1/2 tablet in the morning and 1/2 tablet in afternoon. She continues to sleep. She denies any increase in anxiety.   She is recovering from URI but has persistent cough. No fever, chills, body aches. She has used OTC medications with some relief but cannot get 100 percent better. She is really not able to get a lot of mucus up.   .. 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   Amenorrhea 01/03/2020   Arthralgia 01/03/2020   Menopause syndrome 04/09/2021   Post-menopausal 04/12/2021   Class 1 obesity due to excess calories without serious comorbidity with body mass index (BMI) of 32.0 to 32.9 in adult 05/23/2021   Abnormal weight gain 05/23/2021   Resolved Ambulatory Problems    Diagnosis Date Noted   Postpartum care following vaginal delivery (6/16) 03/14/2013   Subacromial bursitis 10/03/2013   Plantar fasciitis, right 10/31/2013   Onychomycosis 11/02/2013   Biceps tendinitis on left 11/06/2013   Sebaceous cyst 05/15/2014   Metatarsal fracture 10/10/2014    Depression 09/03/2015   History of miscarriage 12/10/2015   Other fatigue 02/26/2016   Missed periods 02/26/2016   Moody 02/26/2016   Gastritis 04/07/2016   Acute maxillary sinusitis 08/27/2016   Gastroenteritis 12/11/2016   Anxiety state 06/30/2017   Class 1 obesity due to excess calories without serious comorbidity with body mass index (BMI) of 30.0 to 30.9 in adult 06/23/2018   Past Medical History:  Diagnosis Date   AMA (advanced maternal age) multigravida 35+    Anxiety    MTHFR mutation       ROS   See HPI.  Objective:     BP (!) 148/95   Pulse 98   Ht 5\' 9"  (1.753 m)   Wt 206 lb (93.4 kg)   SpO2 99%   BMI 30.42 kg/m  BP Readings from Last 3 Encounters:  07/31/22 (!) 148/95  03/26/22 126/78  10/27/21 130/78   Wt Readings from Last 3 Encounters:  07/31/22 206 lb (93.4 kg)  10/27/21 219 lb (99.3 kg)  05/23/21 207 lb (93.9 kg)      Physical Exam Constitutional:      Appearance: Normal appearance.  HENT:     Head: Normocephalic.  Cardiovascular:     Rate and Rhythm: Normal rate and regular rhythm.     Pulses: Normal pulses.  Pulmonary:     Effort: Pulmonary effort is normal.     Comments: Rhonchi central chest with cough Neurological:     General: No focal deficit present.     Mental Status: She  is alert and oriented to person, place, and time.  Psychiatric:        Mood and Affect: Mood normal.         Assessment & Plan:  Marland KitchenMarland KitchenAmber was seen today for adhd.  Diagnoses and all orders for this visit:  Attention deficit hyperactivity disorder (ADHD), predominantly inattentive type -     amphetamine-dextroamphetamine (ADDERALL) 20 MG tablet; Take 1 tablet (20 mg total) by mouth 3 (three) times daily. -     amphetamine-dextroamphetamine (ADDERALL) 20 MG tablet; Take 1 tablet (20 mg total) by mouth 3 (three) times daily. -     amphetamine-dextroamphetamine (ADDERALL) 20 MG tablet; Take 1 tablet (20 mg total) by mouth 3 (three) times daily.  Acute  bronchitis, unspecified organism -     methylPREDNISolone (MEDROL DOSEPAK) 4 MG TBPK tablet; Take as directed by package insert.   BP up some but getting over URI with cough Medrol dose pack given  Consider abx if not improving or worsening  Adderall increased to 20mg  TID Follow up in 3 months   Iran Planas, PA-C

## 2022-09-08 ENCOUNTER — Ambulatory Visit: Payer: Self-pay | Admitting: Physician Assistant

## 2022-09-08 VITALS — BP 124/93 | HR 106 | Temp 98.4°F | Ht 69.0 in | Wt 206.0 lb

## 2022-09-08 DIAGNOSIS — L723 Sebaceous cyst: Secondary | ICD-10-CM

## 2022-09-08 NOTE — Patient Instructions (Signed)
Epidermoid Cyst Removal, Care After This sheet gives you information about how to care for yourself after your procedure. Your health care provider may also give you more specific instructions. If you have problems or questions, contact your health care provider. What can I expect after the procedure? After the procedure, it is common to have: Soreness in the area where your cyst was removed. Tightness or itchiness from the stitches (sutures) in your skin. Follow these instructions at home: Medicines Take over-the-counter and prescription medicines only as told by your health care provider. If you were prescribed an antibiotic medicine or ointment, take or apply it as told by your health care provider. Do not stop using the antibiotic even if you start to feel better. Incision care  Follow instructions from your health care provider about how to take care of your incision. Make sure you: Wash your hands with soap and water for at least 20 seconds before you change your bandage (dressing). If soap and water are not available, use hand sanitizer. Change your dressing as told by your health care provider. Leave sutures, skin glue, or adhesive strips in place. These skin closures may need to stay in place for 1-2 weeks or longer. If adhesive strip edges start to loosen and curl up, you may trim the loose edges. Do not remove adhesive strips completely unless your health care provider tells you to do that. Keep the dressing dry until your health care provider says that it can be removed. After your dressing is off, check your incision area every day for signs of infection. Check for: Redness, swelling, or pain. Fluid or blood. Warmth. Pus or a bad smell. General instructions Do not take baths, swim, or use a hot tub until your health care provider approves. Ask your health care provider if you may take showers. You may only be allowed to take sponge baths. Your health care provider may ask you to  avoid contact sports or activities that take a lot of effort. Do not do anything that stretches or puts pressure on your incision. You can return to your normal diet. Keep all follow-up visits. This is important. Contact a health care provider if: You have a fever. You have redness, swelling, or pain in the incision area. You have fluid or blood coming from your incision. You have pus or a bad smell coming from your incision. Your incision feels warm to the touch. Your cyst grows back. Get help right away if: If the incision site suddenly increases in size and you have pain at the incision site. You may be checked for a collection of blood under the skin from the procedure (hematoma). Summary After the procedure, it is common to have soreness in the area where your cyst was removed. Take or apply over-the-counter and prescription medicines only as told by your health care provider. Follow instructions from your health care provider about how to take care of your incision. This information is not intended to replace advice given to you by your health care provider. Make sure you discuss any questions you have with your health care provider. Document Revised: 12/20/2019 Document Reviewed: 12/20/2019 Elsevier Patient Education  2023 Else[employer] Inc. Epidermoid Cyst  An epidermoid cyst, also known as epidermal cyst, is a sac made of skin tissue. The sac contains a substance called keratin. Keratin is a protein that is normally secreted through the hair follicles. When keratin becomes trapped in the top layer of skin (epidermis), it can form an epidermoid  cyst. Epidermoid cysts can be found anywhere on your body. These cysts are usually harmless (benign), and they may not cause symptoms unless they become inflamed or infected. What are the causes? This condition may be caused by: A blocked hair follicle. A hair that curls and re-enters the skin instead of growing straight out of the skin (ingrown  hair). A blocked pore. Irritated skin. An injury to the skin. Certain conditions that are passed along from parent to child (inherited). Human papillomavirus (HPV). This happens rarely when cysts occur on the bottom of the feet. Long-term (chronic) sun damage to the skin. What increases the risk? The following factors may make you more likely to develop an epidermoid cyst: Having acne. Being female. Having an injury to the skin. Being past puberty. Having certain rare genetic disorders. What are the signs or symptoms? The only symptom of this condition may be a small, painless lump underneath the skin. When an epidermal cyst ruptures, it may become inflamed. True infection in cysts is rare. Symptoms may include: Redness. Inflammation. Tenderness. Warmth. Keratin draining from the cyst. Keratin is grayish-white, bad-smelling substance. Pus draining from the cyst. How is this diagnosed? This condition is diagnosed with a physical exam. In some cases, you may have a sample of tissue (biopsy) taken from your cyst to be examined under a microscope or tested for bacteria. You may be referred to a health care provider who specializes in skin care (dermatologist). How is this treated? If a cyst becomes inflamed, treatment may include: Opening and draining the cyst, done by a health care provider. After draining, minor surgery to remove the rest of the cyst may be done. Taking antibiotic medicine. Having injections of medicines (steroids) that help to reduce inflammation. Having surgery to remove the cyst. Surgery may be done if the cyst: Becomes large. Bothers you. Has a chance of turning into cancer. Do not try to open a cyst yourself. Follow these instructions at home: Medicines If you were prescribed an antibiotic medicine, take it it as told by your health care provider. Do not stop using the antibiotic even if you start to feel better. Take over-the-counter and prescription  medicines only as told by your health care provider. General instructions Keep the area around your cyst clean and dry. Wear loose, dry clothing. Avoid touching your cyst. Check your cyst every day for signs of infection. Check for: Redness, swelling, or pain. Fluid or blood. Warmth. Pus or a bad smell. Keep all follow-up visits. This is important. How is this prevented? Wear clean, dry, clothing. Avoid wearing tight clothing. Keep your skin clean and dry. Take showers or baths every day. Contact a health care provider if: Your cyst develops symptoms of infection. Your condition is not improving or is getting worse. You develop a cyst that looks different from other cysts you have had. You have a fever. Get help right away if: Redness spreads from the cyst into the surrounding area. Summary An epidermoid cyst is a sac made of skin tissue. These cysts are usually harmless (benign), and they may not cause symptoms unless they become inflamed. If a cyst becomes inflamed, treatment may include surgery to open and drain the cyst, or to remove it. Treatment may also include medicines by mouth or through an injection. Take over-the-counter and prescription medicines only as told by your health care provider. If you were prescribed an antibiotic medicine, take it as told by your health care provider. Do not stop using the antibiotic even  if you start to feel better. Contact a health care provider if your condition is not improving or is getting worse. Keep all follow-up visits as told by your health care provider. This is important. This information is not intended to replace advice given to you by your health care provider. Make sure you discuss any questions you have with your health care provider. Document Revised: 12/20/2019 Document Reviewed: 12/20/2019 Elsevier Patient Education  2023 [employer].

## 2022-09-08 NOTE — Progress Notes (Signed)
Acute Office Visit  Subjective:     Patient ID: Haley Church, female    DOB: 11-04-1975, 46 y.o.   MRN: [id number]  Chief Complaint  Patient presents with   Follow-up    HPI Patient is in today for large cyst on her right shoulder that has been there for years but recently getting bigger and now "smelling".   .. Active Ambulatory Problems    Diagnosis Date Noted   Attention deficit hyperactivity disorder (ADHD), 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   Sebaceous cyst 05/15/2014   Anxiety as acute reaction to exceptional stress 12/17/2014   Postpartum care following cesarean delivery 02/04/2018   S/P cesarean section - indication: Vasa Previa 5/10 02/05/2018   Maternal anemia, with delivery 02/05/2018   Granuloma of great toe 07/23/2018   Bilateral plantar fasciitis 07/23/2018   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   Ingrown toenail of both feet 07/23/2018   Overweight 11/11/2018   Elevated blood pressure reading 11/11/2018   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   Amenorrhea 01/03/2020   Arthralgia 01/03/2020   Menopause syndrome 04/09/2021   Post-menopausal 04/12/2021   Class 1 obesity due to excess calories without serious comorbidity with body mass index (BMI) of 32.0 to 32.9 in adult 05/23/2021   Abnormal weight gain 05/23/2021   Resolved Ambulatory Problems    Diagnosis Date Noted   Postpartum care following vaginal delivery (6/16) 03/14/2013   Subacromial bursitis 10/03/2013   Plantar fasciitis, right 10/31/2013   Onychomycosis 11/02/2013   Biceps tendinitis on left 11/06/2013   Metatarsal fracture 10/10/2014   Depression 09/03/2015   History of miscarriage 12/10/2015   Other fatigue 02/26/2016   Missed periods 02/26/2016   Moody 02/26/2016   Gastritis 04/07/2016   Acute maxillary sinusitis 08/27/2016   Gastroenteritis 12/11/2016   Anxiety state 06/30/2017   Class 1 obesity due to excess calories without serious comorbidity with body mass index (BMI) of 30.0 to  30.9 in adult 06/23/2018   Past Medical History:  Diagnosis Date   AMA (advanced maternal age) multigravida 35+    Anxiety    MTHFR mutation      ROS  See HPI.     Objective:    BP (!) 124/93   Pulse (!) 106   Temp 98.4 F (36.9 C) (Oral)   Ht 5\' 9"  (1.753 m)   Wt 206 lb (93.4 kg)   SpO2 99%   BMI 30.42 kg/m  BP Readings from Last 3 Encounters:  09/08/22 (!) 124/93  07/31/22 (!) 148/95  03/26/22 126/78   Wt Readings from Last 3 Encounters:  09/08/22 206 lb (93.4 kg)  07/31/22 206 lb (93.4 kg)  10/27/21 219 lb (99.3 kg)      Physical Exam  2.5cm by 3cm by 2cm fluctuant cyst with central punctate hole and odor and white thick discharge coming from it. Not warm or tender to touch.    Sebaceous Cyst Excision Procedure Note  Pre-operative Diagnosis: Epidermal cyst  Post-operative Diagnosis: same  Locations: right upper shoulder  Indications: draining and increasing in size  Anesthesia: Lidocaine 2% without epinephrine without added sodium bicarbonate  Procedure Details  History of allergy to iodine: no  Patient informed of the risks (including bleeding and infection) and benefits of the  procedure and Verbal informed consent obtained.  The lesion and surrounding area was given a sterile prep using chlorhexidine and draped in the usual sterile fashion. An incision was made over the cyst, which was dissected free of the surrounding tissue and removed.  The  cyst was filled with typical sebaceous material.  The wound was closed with 4-0 Prolene using simple interrupted stitches. Antibiotic ointment and a sterile dressing applied.  The specimen was not sent for pathologic examination. The patient tolerated the procedure well.  [id number]  Condition: Stable  Complications: none.  Plan: 1. Instructed to keep the wound dry and covered for 24-48h and clean thereafter. 2. Warning signs of infection were reviewed.   3. Recommended that the patient use OTC  acetaminophen as needed for pain.  4. Return for suture removal in 10 days.      Assessment & Plan:  Marland KitchenMarland KitchenAmber was seen today for follow-up.  Diagnoses and all orders for this visit:  Sebaceous cyst   Not infected. Excised today HO given RTC in 9 days to have sutures removed     Iran Planas, PA-C

## 2022-09-11 ENCOUNTER — Encounter: Payer: Self-pay | Admitting: Physician Assistant

## 2022-09-16 ENCOUNTER — Ambulatory Visit (INDEPENDENT_AMBULATORY_CARE_PROVIDER_SITE_OTHER): Payer: Self-pay | Admitting: Physician Assistant

## 2022-09-16 ENCOUNTER — Encounter: Payer: Self-pay | Admitting: Physician Assistant

## 2022-09-16 VITALS — BP 152/90 | HR 113 | Ht 69.0 in | Wt 207.0 lb

## 2022-09-16 DIAGNOSIS — L723 Sebaceous cyst: Secondary | ICD-10-CM

## 2022-09-16 DIAGNOSIS — Z4802 Encounter for removal of sutures: Secondary | ICD-10-CM

## 2022-09-16 NOTE — Progress Notes (Signed)
   Established Patient Office Visit  Subjective   Patient ID: Haley Church, female    DOB: 1976/06/07  Age: 46 y.o. MRN: [id number]  Chief Complaint  Patient presents with   Suture / Staple Removal    Room 2    HPI Pt had sebaceous cyst removal 7 days ago. No concerns or problems. She is having sutures removed today.     Objective:     BP (!) 152/90   Pulse (!) 113   Ht 5\' 9"  (1.753 m)   Wt 207 lb (93.9 kg)   SpO2 99%   BMI 30.57 kg/m  BP Readings from Last 3 Encounters:  09/16/22 (!) 152/90  09/08/22 (!) 124/93  07/31/22 (!) 148/95   Wt Readings from Last 3 Encounters:  09/16/22 207 lb (93.9 kg)  09/08/22 206 lb (93.4 kg)  07/31/22 206 lb (93.4 kg)      Physical Exam  3 sutures from incision on right upper shoulder/back. No signs of inflammation/swelling or drainage. Not tender to touch. On suture fell out.  2 sutures removed today without complication.  Small area of separation in incision Closed with dermabond   Assessment & Plan:  13/03/23Marland KitchenAmber was seen today for suture / staple removal.  Diagnoses and all orders for this visit:  Sebaceous cyst  Visit for suture removal   Healing well Sutures removed Dermabond placed Follow up as needed   [name], PA-C

## 2022-12-18 ENCOUNTER — Encounter: Payer: Self-pay | Admitting: Physician Assistant

## 2022-12-18 DIAGNOSIS — F900 Attention-deficit hyperactivity disorder, predominantly inattentive type: Secondary | ICD-10-CM

## 2022-12-21 MED ORDER — AMPHETAMINE-DEXTROAMPHETAMINE 20 MG PO TABS: 20.0000 mg | ORAL_TABLET | Freq: Three times a day (TID) | ORAL | 0 refills | Status: DC

## 2023-02-01 ENCOUNTER — Encounter: Payer: Self-pay | Admitting: Physician Assistant

## 2023-02-01 DIAGNOSIS — F900 Attention-deficit hyperactivity disorder, predominantly inattentive type: Secondary | ICD-10-CM

## 2023-02-01 MED ORDER — AMPHETAMINE-DEXTROAMPHETAMINE 20 MG PO TABS: 20.0000 mg | ORAL_TABLET | Freq: Three times a day (TID) | ORAL | 0 refills | Status: DC

## 2023-02-03 ENCOUNTER — Ambulatory Visit (INDEPENDENT_AMBULATORY_CARE_PROVIDER_SITE_OTHER): Payer: No Typology Code available for payment source | Admitting: Physician Assistant

## 2023-02-03 ENCOUNTER — Encounter: Payer: Self-pay | Admitting: Physician Assistant

## 2023-02-03 VITALS — BP 136/71 | HR 97 | Ht 69.0 in | Wt 200.1 lb

## 2023-02-03 DIAGNOSIS — F900 Attention-deficit hyperactivity disorder, predominantly inattentive type: Secondary | ICD-10-CM

## 2023-02-03 DIAGNOSIS — N951 Menopausal and female climacteric states: Secondary | ICD-10-CM

## 2023-02-03 MED ORDER — AMPHETAMINE-DEXTROAMPHETAMINE 20 MG PO TABS
20.0000 mg | ORAL_TABLET | Freq: Three times a day (TID) | ORAL | 0 refills | Status: DC
Start: 2023-03-31 — End: 2023-05-18

## 2023-02-03 MED ORDER — AMPHETAMINE-DEXTROAMPHETAMINE 20 MG PO TABS: 20.0000 mg | ORAL_TABLET | Freq: Three times a day (TID) | ORAL | 0 refills | Status: DC

## 2023-02-03 MED ORDER — AMPHETAMINE-DEXTROAMPHETAMINE 20 MG PO TABS
20.0000 mg | ORAL_TABLET | Freq: Three times a day (TID) | ORAL | 0 refills | Status: DC
Start: 2023-04-29 — End: 2023-05-18

## 2023-02-03 NOTE — Progress Notes (Signed)
Established Patient Office Visit  Subjective   Patient ID: Haley Church, female    DOB: 07-01-1976  Age: 47 y.o. MRN: [id number]  Chief Complaint  Patient presents with   Follow-up    HPI Pt is a 47 yo female who presents to the clinic for ADHD follow up and medication refills. She is doing well with medication. No concerns there.   She continues to struggle with menopausal symptoms. Mood, sleep, weight, energy, hot flashes. She has tried OTC supplements for hormone balance. She has appt with a provider who employs diet changes to help with symptoms and at times HRT.   .. Active Ambulatory Problems    Diagnosis Date Noted   Attention deficit hyperactivity disorder (ADHD), 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   Sebaceous cyst 05/15/2014   Anxiety as acute reaction to exceptional stress 12/17/2014   Postpartum care following cesarean delivery 02/04/2018   S/P cesarean section - indication: Vasa Previa 5/10 02/05/2018   Maternal anemia, with delivery 02/05/2018   Granuloma of great toe 07/23/2018   Bilateral plantar fasciitis 07/23/2018   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   Ingrown toenail of both feet 07/23/2018   Overweight 11/11/2018   Elevated blood pressure reading 11/11/2018   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   Amenorrhea 01/03/2020   Arthralgia 01/03/2020   Menopause syndrome 04/09/2021   Post-menopausal 04/12/2021   Class 1 obesity due to excess calories without serious comorbidity with body mass index (BMI) of 32.0 to 32.9 in adult 05/23/2021   Abnormal weight gain 05/23/2021   Menopausal symptoms 02/05/2023   Resolved Ambulatory Problems    Diagnosis Date Noted   Postpartum care following vaginal delivery (6/16) 03/14/2013   Subacromial bursitis 10/03/2013   Plantar fasciitis, right 10/31/2013   Onychomycosis 11/02/2013   Biceps tendinitis on left 11/06/2013   Metatarsal fracture 10/10/2014   Depression 09/03/2015   History of miscarriage 12/10/2015   Other  fatigue 02/26/2016   Missed periods 02/26/2016   Moody 02/26/2016   Gastritis 04/07/2016   Acute maxillary sinusitis 08/27/2016   Gastroenteritis 12/11/2016   Anxiety state 06/30/2017   Class 1 obesity due to excess calories without serious comorbidity with body mass index (BMI) of 30.0 to 30.9 in adult 06/23/2018   Past Medical History:  Diagnosis Date   AMA (advanced maternal age) multigravida 35+    Anxiety    MTHFR mutation     ROS   See HPI.  Objective:     BP 136/71   Pulse 97   Ht 5\' 9"  (1.753 m)   Wt 200 lb 1.6 oz (90.8 kg)   SpO2 99%   BMI 29.55 kg/m  BP Readings from Last 3 Encounters:  02/03/23 136/71  09/16/22 (!) 152/90  09/08/22 (!) 124/93   Wt Readings from Last 3 Encounters:  02/03/23 200 lb 1.6 oz (90.8 kg)  09/16/22 207 lb (93.9 kg)  09/08/22 206 lb (93.4 kg)    ..    02/03/2023   10:28 AM 07/31/2022    1:20 PM 03/26/2022    9:30 AM 10/27/2021    1:42 PM 08/26/2020   11:00 AM  Depression screen PHQ 2/9  Decreased Interest 0 0 0 0 0  Down, Depressed, Hopeless 0 0 0 0 0  PHQ - 2 Score 0 0 0 0 0  Altered sleeping 1  0    Tired, decreased energy   0    Change in appetite   0    Feeling bad or failure about yourself  0    Trouble concentrating   0    Moving slowly or fidgety/restless   0    Suicidal thoughts   0    PHQ-9 Score 1  0      Physical Exam Constitutional:      Appearance: Normal appearance.  HENT:     Head: Normocephalic.  Cardiovascular:     Rate and Rhythm: Normal rate and regular rhythm.     Pulses: Normal pulses.  Neurological:     General: No focal deficit present.     Mental Status: She is alert and oriented to person, place, and time.  Psychiatric:        Mood and Affect: Mood normal.       The 10-year ASCVD risk score (Arnett DK, et al., 2019) is: 1.1%    Assessment & Plan:  Marland KitchenMarland KitchenAmber was seen today for follow-up.  Diagnoses and all orders for this visit:  Attention deficit hyperactivity disorder (ADHD),  predominantly inattentive type -     amphetamine-dextroamphetamine (ADDERALL) 20 MG tablet; Take 1 tablet (20 mg total) by mouth 3 (three) times daily. -     amphetamine-dextroamphetamine (ADDERALL) 20 MG tablet; Take 1 tablet (20 mg total) by mouth 3 (three) times daily. -     amphetamine-dextroamphetamine (ADDERALL) 20 MG tablet; Take 1 tablet (20 mg total) by mouth 3 (three) times daily. -     TSH -     COMPLETE METABOLIC PANEL WITH GFR  Menopausal symptoms -     FSH/LH -     Estradiol -     CP Testosterone, BIO-Female/Children -     Progesterone -     TSH -     COMPLETE METABOLIC PANEL WITH GFR   Adderall refill Labs ordered for management Discussed treatment for menopausal symptoms and discussed HRT No hx of BC, blood clots, and does not smoke.  Pt aware she is candidate for HRT.  Will get labs first and then consider options    Tandy Gaw, PA-C

## 2023-02-04 LAB — TSH: TSH: 1.79 mIU/L

## 2023-02-04 LAB — COMPLETE METABOLIC PANEL WITH GFR
Albumin: 4.3 g/dL (ref 3.6–5.1)
Potassium: 4.1 mmol/L (ref 3.5–5.3)
Sodium: 141 mmol/L (ref 135–146)
Total Bilirubin: 0.4 mg/dL (ref 0.2–1.2)

## 2023-02-04 LAB — FSH/LH: LH: 25.4 m[IU]/mL

## 2023-02-05 DIAGNOSIS — N951 Menopausal and female climacteric states: Secondary | ICD-10-CM | POA: Insufficient documentation

## 2023-02-05 NOTE — Progress Notes (Signed)
Davis,   Kidney, liver, glucose look good.  Thyroid looks great.  Progesterone is low.  Your estrogen is pretty good and better than 1 year ago.  Still waiting for testosterone.

## 2023-02-06 LAB — ESTRADIOL: Estradiol: 87 pg/mL

## 2023-02-06 LAB — COMPLETE METABOLIC PANEL WITH GFR
AG Ratio: 1.8 (calc) (ref 1.0–2.5)
ALT: 27 U/L (ref 6–29)
AST: 22 U/L (ref 10–35)
Alkaline phosphatase (APISO): 102 U/L (ref 31–125)
BUN: 12 mg/dL (ref 7–25)
CO2: 28 mmol/L (ref 20–32)
Calcium: 9.8 mg/dL (ref 8.6–10.2)
Chloride: 104 mmol/L (ref 98–110)
Creat: 0.89 mg/dL (ref 0.50–0.99)
Globulin: 2.4 g/dL (calc) (ref 1.9–3.7)
Glucose, Bld: 98 mg/dL (ref 65–99)
Total Protein: 6.7 g/dL (ref 6.1–8.1)
eGFR: 80 mL/min/{1.73_m2} (ref >=60)

## 2023-02-06 LAB — CP TESTOSTERONE, BIO-FEMALE/CHILDREN
Albumin: 4.3 g/dL (ref 3.6–5.1)
Sex Hormone Binding: 50.6 nmol/L (ref 17–124)
TESTOSTERONE, BIOAVAILABLE: 3.9 ng/dL (ref 0.5–8.5)
Testosterone, Free: 2 pg/mL (ref 0.2–5.0)
Testosterone, Total, LC-MS-MS: 24 ng/dL (ref 2–45)

## 2023-02-06 LAB — FSH/LH: FSH: 60.2 m[IU]/mL

## 2023-02-06 LAB — PROGESTERONE: Progesterone: <0.5 ng/mL

## 2023-02-08 NOTE — Progress Notes (Signed)
Testosterone is really good. Let me know if you would like me to send labs to [facility] or to try Prometrium prescription for low progesterone.

## 2023-03-12 ENCOUNTER — Ambulatory Visit (INDEPENDENT_AMBULATORY_CARE_PROVIDER_SITE_OTHER): Payer: No Typology Code available for payment source | Admitting: Physician Assistant

## 2023-03-12 ENCOUNTER — Ambulatory Visit (INDEPENDENT_AMBULATORY_CARE_PROVIDER_SITE_OTHER): Payer: No Typology Code available for payment source

## 2023-03-12 ENCOUNTER — Encounter: Payer: Self-pay | Admitting: Physician Assistant

## 2023-03-12 VITALS — BP 137/86 | HR 92 | Ht 69.0 in | Wt 211.5 lb

## 2023-03-12 DIAGNOSIS — M542 Cervicalgia: Secondary | ICD-10-CM | POA: Insufficient documentation

## 2023-03-12 MED ORDER — PREDNISONE 50 MG PO TABS
ORAL_TABLET | ORAL | 0 refills | Status: DC
Start: 2023-03-12 — End: 2023-05-18

## 2023-03-12 MED ORDER — CYCLOBENZAPRINE HCL 10 MG PO TABS
10.0000 mg | ORAL_TABLET | Freq: Three times a day (TID) | ORAL | 0 refills | Status: DC | PRN
Start: 2023-03-12 — End: 2023-12-01

## 2023-03-12 MED ORDER — TRAMADOL HCL 50 MG PO TABS
50.0000 mg | ORAL_TABLET | Freq: Three times a day (TID) | ORAL | 0 refills | Status: AC | PRN
Start: 2023-03-12 — End: 2023-03-17

## 2023-03-12 NOTE — Progress Notes (Unsigned)
Spoke with pt. Pt verbalized understanding.

## 2023-03-12 NOTE — Progress Notes (Signed)
Muscle spasm noted with minimal arthritic changes of cervical spine. No acute findings. Treatment plan stays the same.

## 2023-03-12 NOTE — Progress Notes (Signed)
Acute Office Visit  Subjective:     Patient ID: Haley Church, female    DOB: September 13, 1976, 47 y.o.   MRN: [id number]  Chief Complaint  Patient presents with   Neck Pain    Patient c/o left sided shooting pain in neck x  few months.     HPI Patient is in today for neck pain on the left side that has been constant and worsening for the last 2-3 months. She denies any known injury or history of neck pain. Pain does shoot into her upper left back at times but does not go into her arm. It will cause her to cry at times from pain. Ibuprofen helps some.   .. Active Ambulatory Problems    Diagnosis Date Noted   Attention deficit hyperactivity disorder (ADHD), 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   Sebaceous cyst 05/15/2014   Anxiety as acute reaction to exceptional stress 12/17/2014   Postpartum care following cesarean delivery 02/04/2018   S/P cesarean section - indication: Vasa Previa 5/10 02/05/2018   Maternal anemia, with delivery 02/05/2018   Granuloma of great toe 07/23/2018   Bilateral plantar fasciitis 07/23/2018   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   Ingrown toenail of both feet 07/23/2018   Overweight 11/11/2018   Elevated blood pressure reading 11/11/2018   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   Amenorrhea 01/03/2020   Arthralgia 01/03/2020   Menopause syndrome 04/09/2021   Post-menopausal 04/12/2021   Class 1 obesity due to excess calories without serious comorbidity with body mass index (BMI) of 32.0 to 32.9 in adult 05/23/2021   Abnormal weight gain 05/23/2021   Menopausal symptoms 02/05/2023   Neck pain on left side 03/12/2023   Resolved Ambulatory Problems    Diagnosis Date Noted   Postpartum care following vaginal delivery (6/16) 03/14/2013   Subacromial bursitis 10/03/2013   Plantar fasciitis, right 10/31/2013   Onychomycosis 11/02/2013   Biceps tendinitis on left 11/06/2013   Metatarsal fracture 10/10/2014   Depression 09/03/2015   History of miscarriage  12/10/2015   Other fatigue 02/26/2016   Missed periods 02/26/2016   Moody 02/26/2016   Gastritis 04/07/2016   Acute maxillary sinusitis 08/27/2016   Gastroenteritis 12/11/2016   Anxiety state 06/30/2017   Class 1 obesity due to excess calories without serious comorbidity with body mass index (BMI) of 30.0 to 30.9 in adult 06/23/2018   Past Medical History:  Diagnosis Date   AMA (advanced maternal age) multigravida 35+    Anxiety    MTHFR mutation      ROS  See HPI.     Objective:    BP 137/86   Pulse 92   Ht 5\' 9"  (1.753 m)   Wt 211 lb 8 oz (95.9 kg)   SpO2 100%   BMI 31.23 kg/m  BP Readings from Last 3 Encounters:  03/12/23 137/86  02/03/23 136/71  09/16/22 (!) 152/90   Wt Readings from Last 3 Encounters:  03/12/23 211 lb 8 oz (95.9 kg)  02/03/23 200 lb 1.6 oz (90.8 kg)  09/16/22 207 lb (93.9 kg)      Physical Exam Constitutional:      Appearance: Normal appearance.  HENT:     Head: Normocephalic.  Cardiovascular:     Rate and Rhythm: Normal rate.  Pulmonary:     Effort: Pulmonary effort is normal.  Musculoskeletal:     Comments: NROM with some pain with movement to the left Negative Spurling test NROM upper extremities Tenderness and tightness to palpation over cervical spine and left paraspinal muscles  Neurological:     General: No focal deficit present.     Mental Status: She is alert and oriented to person, place, and time.  Psychiatric:        Mood and Affect: Mood normal.          Assessment & Plan:  Marland KitchenMarland KitchenAmber was seen today for neck pain.  Diagnoses and all orders for this visit:  Neck pain on left side -     DG Cervical Spine Complete; Future -     predniSONE (DELTASONE) 50 MG tablet; One tab PO daily for 5 days. -     cyclobenzaprine (FLEXERIL) 10 MG tablet; Take 1 tablet (10 mg total) by mouth 3 (three) times daily as needed for muscle spasms. -     traMADol (ULTRAM) 50 MG tablet; Take 1 tablet (50 mg total) by mouth every 8  (eight) hours as needed for up to 5 days. -     Ambulatory referral to Physical Therapy   Xray ordered today Prednisone burst with muscle relaxer Tramadol as needed Formal PT to include STIM and dry needling ordered HO given  Follow up as needed or if symptoms worsen or persist  Tandy Gaw, PA-C

## 2023-03-12 NOTE — Patient Instructions (Addendum)
Will order formal PT to consider dry needling/STIM therapy and massage Tramadol as needed for break through pain  Cervical Strain and Sprain Rehab Ask your health care provider which exercises are safe for you. Do exercises exactly as told by your health care provider and adjust them as directed. It is normal to feel mild stretching, pulling, tightness, or discomfort as you do these exercises. Stop right away if you feel sudden pain or your pain gets worse. Do not begin these exercises until told by your health care provider. Stretching and range-of-motion exercises Cervical side bending  Using good posture, sit on a stable chair or stand up. Without moving your shoulders, slowly tilt your left / right ear to your shoulder until you feel a stretch in the neck muscles on the opposite side. You should be looking straight ahead. Hold for __________ seconds. Repeat with the other side of your neck. Repeat __________ times. Complete this exercise __________ times a day. Cervical rotation  Using good posture, sit on a stable chair or stand up. Slowly turn your head to the side as if you are looking over your left / right shoulder. Keep your eyes level with the ground. Stop when you feel a stretch along the side and the back of your neck. Hold for __________ seconds. Repeat this by turning to your other side. Repeat __________ times. Complete this exercise __________ times a day. Thoracic extension and pectoral stretch  Roll a towel or a small blanket so it is about 4 inches (10 cm) in diameter. Lie down on your back on a firm surface. Put the towel in the middle of your back across your spine. It should not be under your shoulder blades. Put your hands behind your head and let your elbows fall out to your sides. Hold for __________ seconds. Repeat __________ times. Complete this exercise __________ times a day. Strengthening exercises Upper cervical flexion  Lie on your back with a thin  pillow behind your head or a small, rolled-up towel under your neck. Gently tuck your chin toward your chest and nod your head down to look toward your feet. Do not lift your head off the pillow. Hold for __________ seconds. Release the tension slowly. Relax your neck muscles completely before you repeat this exercise. Repeat __________ times. Complete this exercise __________ times a day. Cervical extension  Stand about 6 inches (15 cm) away from a wall, with your back facing the wall. Place a soft object, about 6-8 inches (15-20 cm) in diameter, between the back of your head and the wall. A soft object could be a small pillow, a ball, or a folded towel. Gently tilt your head back and press into the soft object. Keep your jaw and forehead relaxed. Hold for __________ seconds. Release the tension slowly. Relax your neck muscles completely before you repeat this exercise. Repeat __________ times. Complete this exercise __________ times a day. Posture and body mechanics Body mechanics refer to the movements and positions of your body while you do your daily activities. Posture is part of body mechanics. Good posture and healthy body mechanics can help to relieve stress in your body's tissues and joints. Good posture means that your spine is in its natural S-curve position (your spine is neutral), your shoulders are pulled back slightly, and your head is not tipped forward. The following are general guidelines for using improved posture and body mechanics in your everyday activities. Sitting  When sitting, keep your spine neutral and keep your feet  flat on the floor. Use a footrest, if needed, and keep your thighs parallel to the floor. Avoid rounding your shoulders. Avoid tilting your head forward. When working at a desk or a computer, keep your desk at a height where your hands are slightly lower than your elbows. Slide your chair under your desk so you are close enough to maintain good  posture. When working at a computer, place your monitor at a height where you are looking straight ahead and you do not have to tilt your head forward or downward to look at the screen. Standing  When standing, keep your spine neutral and keep your feet about hip-width apart. Keep a slight bend in your knees. Your ears, shoulders, and hips should line up. When you do a task in which you stand in one place for a long time, place one foot up on a stable object that is 2-4 inches (5-10 cm) high, such as a footstool. This helps keep your spine neutral. Resting When lying down and resting, avoid positions that are most painful for you. Try to support your neck in a neutral position. You can use a contour pillow or a small rolled-up towel. Your pillow should support your neck but not push on it. This information is not intended to replace advice given to you by your health care provider. Make sure you discuss any questions you have with your health care provider. Document Revised: 04/06/2022 Document Reviewed: 04/06/2022 Elsevier Patient Education  2024 [employer].

## 2023-05-13 ENCOUNTER — Encounter: Payer: Self-pay | Admitting: Physician Assistant

## 2023-05-18 ENCOUNTER — Ambulatory Visit (INDEPENDENT_AMBULATORY_CARE_PROVIDER_SITE_OTHER): Payer: No Typology Code available for payment source | Admitting: Physician Assistant

## 2023-05-18 ENCOUNTER — Encounter: Payer: Self-pay | Admitting: Physician Assistant

## 2023-05-18 VITALS — BP 124/88 | HR 101 | Ht 69.0 in | Wt 213.0 lb

## 2023-05-18 DIAGNOSIS — R8290 Unspecified abnormal findings in urine: Secondary | ICD-10-CM

## 2023-05-18 DIAGNOSIS — E6609 Other obesity due to excess calories: Secondary | ICD-10-CM | POA: Diagnosis not present

## 2023-05-18 DIAGNOSIS — F900 Attention-deficit hyperactivity disorder, predominantly inattentive type: Secondary | ICD-10-CM | POA: Diagnosis not present

## 2023-05-18 DIAGNOSIS — Z6831 Body mass index (BMI) 31.0-31.9, adult: Secondary | ICD-10-CM | POA: Diagnosis not present

## 2023-05-18 MED ORDER — AMPHETAMINE-DEXTROAMPHETAMINE 20 MG PO TABS: 20.0000 mg | ORAL_TABLET | Freq: Three times a day (TID) | ORAL | 0 refills | Status: DC

## 2023-05-18 MED ORDER — AMPHETAMINE-DEXTROAMPHETAMINE 20 MG PO TABS
20.0000 mg | ORAL_TABLET | Freq: Three times a day (TID) | ORAL | 0 refills | Status: DC
Start: 2023-07-26 — End: 2023-09-20

## 2023-05-18 MED ORDER — TOPIRAMATE 50 MG PO TABS
ORAL_TABLET | ORAL | 1 refills | Status: DC
Start: 2023-05-18 — End: 2023-07-27

## 2023-05-18 MED ORDER — WEGOVY 0.25 MG/0.5ML ~~LOC~~ SOAJ
0.2500 mg | SUBCUTANEOUS | 0 refills | Status: DC
Start: 2023-05-18 — End: 2023-12-01

## 2023-05-18 NOTE — Progress Notes (Signed)
Established Patient Office Visit  Subjective   Patient ID: Haley Church, female    DOB: [birth year]  Age: 47 y.o. MRN: [id number]  Chief Complaint  Patient presents with   Medical Management of Chronic Issues    Wgt Managment    HPI Patient is a 47 yo obese female who presents to the clinic to discuss weight.  She has tried phentermine in the past with minimal results and weight came back after she started.  She is very active and exercises regularly.  She is the heaviest she has ever been other than when she was pregnant.  She wonders what are her choices are for help with weight loss.  The most she can lose on her own has been 4 to 7 pounds and then she ends up gaining it back. She does admit to having headaches frequently. She is doing well on adderall. She does not always take the 3rd dose. She tends to eat the most at night/evening.   A few weeks ago her urine smelled sweet for a few days. It did go back to normal. It was concerning.   .. Active Ambulatory Problems    Diagnosis Date Noted   Attention deficit hyperactivity disorder (ADHD), 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   Sebaceous cyst 05/15/2014   Anxiety as acute reaction to exceptional stress 12/17/2014   Postpartum care following cesarean delivery 02/04/2018   S/P cesarean section - indication: Vasa Previa 5/10 02/05/2018   Maternal anemia, with delivery 02/05/2018   Granuloma of great toe 07/23/2018   Bilateral plantar fasciitis 07/23/2018   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   Ingrown toenail of both feet 07/23/2018   Overweight 11/11/2018   Elevated blood pressure reading 11/11/2018   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   Amenorrhea 01/03/2020   Arthralgia 01/03/2020   Menopause syndrome 04/09/2021   Post-menopausal 04/12/2021   Class 1 obesity due to excess calories without serious comorbidity with body mass index (BMI) of 32.0 to 32.9 in adult 05/23/2021   Abnormal weight gain 05/23/2021   Menopausal symptoms  02/05/2023   Neck pain on left side 03/12/2023   Abnormal urine odor 05/18/2023   Class 1 obesity due to excess calories without serious comorbidity with body mass index (BMI) of 31.0 to 31.9 in adult 05/18/2023   Resolved Ambulatory Problems    Diagnosis Date Noted   Postpartum care following vaginal delivery (6/16) 03/14/2013   Subacromial bursitis 10/03/2013   Plantar fasciitis, right 10/31/2013   Onychomycosis 11/02/2013   Biceps tendinitis on left 11/06/2013   Metatarsal fracture 10/10/2014   Depression 09/03/2015   History of miscarriage 12/10/2015   Other fatigue 02/26/2016   Missed periods 02/26/2016   Moody 02/26/2016   Gastritis 04/07/2016   Acute maxillary sinusitis 08/27/2016   Gastroenteritis 12/11/2016   Anxiety state 06/30/2017   Class 1 obesity due to excess calories without serious comorbidity with body mass index (BMI) of 30.0 to 30.9 in adult 06/23/2018   Past Medical History:  Diagnosis Date   AMA (advanced maternal age) multigravida 35+    Anxiety    MTHFR mutation      ROS See HPI.    Objective:     BP 124/88   Pulse (!) 101   Ht 5\' 9"  (1.753 m)   Wt 213 lb (96.6 kg)   LMP 11/04/2015   SpO2 99%   BMI 31.45 kg/m  BP Readings from Last 3 Encounters:  05/18/23 124/88  03/12/23 137/86  02/03/23 136/71   Wt Readings from Last  3 Encounters:  05/18/23 213 lb (96.6 kg)  03/12/23 211 lb 8 oz (95.9 kg)  02/03/23 200 lb 1.6 oz (90.8 kg)      Physical Exam Constitutional:      Appearance: Normal appearance. She is obese.  HENT:     Head: Normocephalic.  Cardiovascular:     Rate and Rhythm: Normal rate and regular rhythm.  Pulmonary:     Effort: Pulmonary effort is normal.  Neurological:     General: No focal deficit present.     Mental Status: She is alert and oriented to person, place, and time.  Psychiatric:        Mood and Affect: Mood normal.      The 10-year ASCVD risk score (Arnett DK, et al., 2019) is: 1%    Assessment &  Plan:  Marland KitchenMarland KitchenAmber was seen today for medical management of chronic issues.  Diagnoses and all orders for this visit:  Attention deficit hyperactivity disorder (ADHD), predominantly inattentive type -     amphetamine-dextroamphetamine (ADDERALL) 20 MG tablet; Take 1 tablet (20 mg total) by mouth 3 (three) times daily. -     amphetamine-dextroamphetamine (ADDERALL) 20 MG tablet; Take 1 tablet (20 mg total) by mouth 3 (three) times daily. -     amphetamine-dextroamphetamine (ADDERALL) 20 MG tablet; Take 1 tablet (20 mg total) by mouth 3 (three) times daily.  Abnormal urine odor -     Hemoglobin A1c  Class 1 obesity due to excess calories without serious comorbidity with body mass index (BMI) of 31.0 to 31.9 in adult -     WEGOVY 0.25 MG/0.5ML SOAJ; Inject 0.25 mg into the skin once a week. Use this dose for 1 month (4 shots) and then increase to next higher dose. -     topiramate (TOPAMAX) 50 MG tablet; One half tab by mouth daily for a week, then one tab by mouth daily.   [name].Discussed low carb diet with 1500 calories and 80g of protein.  Exercising at least 150 minutes a week.  [employer] could be a Chief Technology Officer.  Discussed medication options Trial of wegovy Discussed side effects and need to titrate Start topamax for headaches and weight loss Discussed side effects Listed all weight loss medications to check with insurance company Follow up in 2 months  Adderall refilled  Follow up in 6 months  Hx of urine odor of sweet A1C ordered   Return in about 2 months (around 07/18/2023).   Spent 35 minutes with patient reviewing chart, discussing medications, and coming up with treatment plan.    Tandy Gaw, PA-C

## 2023-05-18 NOTE — Patient Instructions (Signed)
Topamax Qsymia Contrave Wegovy Zepbound

## 2023-05-21 ENCOUNTER — Encounter: Payer: Self-pay | Admitting: Physician Assistant

## 2023-05-24 MED ORDER — AMBULATORY NON FORMULARY MEDICATION: 1 refills | Status: DC

## 2023-05-24 MED ORDER — AMBULATORY NON FORMULARY MEDICATION: 0 refills | Status: DC

## 2023-06-02 MED ORDER — ONDANSETRON 8 MG PO TBDP: 8.0000 mg | ORAL_TABLET | Freq: Three times a day (TID) | ORAL | 1 refills | Status: DC | PRN

## 2023-06-02 NOTE — Addendum Note (Signed)
Addended by: Jomarie Longs on: 06/02/2023 11:28 AM   Modules accepted: Orders

## 2023-07-26 ENCOUNTER — Ambulatory Visit: Payer: No Typology Code available for payment source | Admitting: Physician Assistant

## 2023-07-27 ENCOUNTER — Other Ambulatory Visit: Payer: Self-pay | Admitting: Physician Assistant

## 2023-07-27 DIAGNOSIS — E66811 Obesity, class 1: Secondary | ICD-10-CM

## 2023-08-23 ENCOUNTER — Other Ambulatory Visit: Payer: Self-pay | Admitting: Physician Assistant

## 2023-08-23 ENCOUNTER — Other Ambulatory Visit: Payer: Self-pay

## 2023-08-23 ENCOUNTER — Encounter: Payer: Self-pay | Admitting: Physician Assistant

## 2023-08-23 MED ORDER — ESTRADIOL 0.05 MG/24HR TD PTTW
1.0000 | MEDICATED_PATCH | TRANSDERMAL | 0 refills | Status: DC
  Filled 2023-08-23: qty 8, 28d supply, fill #0

## 2023-08-23 MED ORDER — POLYMYXIN B-TRIMETHOPRIM 10000-0.1 UNIT/ML-% OP SOLN: 1.0000 [drp] | OPHTHALMIC | 0 refills | Status: DC

## 2023-09-02 ENCOUNTER — Encounter: Payer: Self-pay | Admitting: Physician Assistant

## 2023-09-20 ENCOUNTER — Encounter: Payer: Self-pay | Admitting: Physician Assistant

## 2023-09-20 DIAGNOSIS — F900 Attention-deficit hyperactivity disorder, predominantly inattentive type: Secondary | ICD-10-CM

## 2023-09-20 MED ORDER — AMPHETAMINE-DEXTROAMPHETAMINE 20 MG PO TABS
20.0000 mg | ORAL_TABLET | Freq: Three times a day (TID) | ORAL | 0 refills | Status: DC
Start: 2023-10-20 — End: 2023-12-01

## 2023-09-20 MED ORDER — AMPHETAMINE-DEXTROAMPHETAMINE 20 MG PO TABS
20.0000 mg | ORAL_TABLET | Freq: Three times a day (TID) | ORAL | 0 refills | Status: DC
Start: 2023-09-20 — End: 2023-12-01

## 2023-09-20 MED ORDER — AMPHETAMINE-DEXTROAMPHETAMINE 20 MG PO TABS
20.0000 mg | ORAL_TABLET | Freq: Three times a day (TID) | ORAL | 0 refills | Status: DC
Start: 2023-11-19 — End: 2023-12-15

## 2023-10-14 ENCOUNTER — Encounter: Payer: Self-pay | Admitting: Physician Assistant

## 2023-11-04 ENCOUNTER — Telehealth: Payer: No Typology Code available for payment source | Admitting: Physician Assistant

## 2023-11-04 DIAGNOSIS — J208 Acute bronchitis due to other specified organisms: Secondary | ICD-10-CM | POA: Diagnosis not present

## 2023-11-04 DIAGNOSIS — B9689 Other specified bacterial agents as the cause of diseases classified elsewhere: Secondary | ICD-10-CM

## 2023-11-04 MED ORDER — BENZONATATE 100 MG PO CAPS
100.0000 mg | ORAL_CAPSULE | Freq: Three times a day (TID) | ORAL | 0 refills | Status: DC | PRN
Start: 2023-11-04 — End: 2023-12-01

## 2023-11-04 MED ORDER — DOXYCYCLINE HYCLATE 100 MG PO TABS
100.0000 mg | ORAL_TABLET | Freq: Two times a day (BID) | ORAL | 0 refills | Status: DC
Start: 2023-11-04 — End: 2023-12-01

## 2023-11-04 NOTE — Patient Instructions (Signed)
 Haley Church, thank you for joining Haley Velma Lunger, PA-C for today's virtual visit.  While this provider is not your primary care provider (PCP), if your PCP is located in our provider database this encounter information will be shared with them immediately following your visit.   A [facility] MyChart account gives you access to today's visit and all your visits, tests, and labs performed at [facility]  click here if you don't have a Haley Church MyChart account or go to [email or web address]  Consent: (Patient) Haley Church provided verbal consent for this virtual visit at the beginning of the encounter.  Current Medications:  Current Outpatient Medications:    AMBULATORY NON FORMULARY MEDICATION, Semaglutide  2.5mg  with pyridoxine 10mg  per mL  Inject .3mg  subcutaneously once weekly for 4 weeks then increase to next dose, Disp: 2 mL, Rfl: 0   AMBULATORY NON FORMULARY MEDICATION, Semaglutide  2.5mg  with pyridoxine 10mg  per mL Inject .6mg  once weekly, Disp: 2 mL, Rfl: 1   amphetamine -dextroamphetamine  (ADDERALL) 20 MG tablet, Take 1 tablet (20 mg total) by mouth 3 (three) times daily., Disp: 90 tablet, Rfl: 0   amphetamine -dextroamphetamine  (ADDERALL) 20 MG tablet, Take 1 tablet (20 mg total) by mouth 3 (three) times daily., Disp: 90 tablet, Rfl: 0   [START ON 11/19/2023] amphetamine -dextroamphetamine  (ADDERALL) 20 MG tablet, Take 1 tablet (20 mg total) by mouth 3 (three) times daily., Disp: 90 tablet, Rfl: 0   cyclobenzaprine  (FLEXERIL ) 10 MG tablet, Take 1 tablet (10 mg total) by mouth 3 (three) times daily as needed for muscle spasms., Disp: 30 tablet, Rfl: 0   estradiol  (VIVELLE -DOT) 0.05 MG/24HR patch, Place 1 patch (0.05 mg total) onto the skin 2 (two) times a week. NEEDS APPOINTMENT FOR FURTHER REFILLS., Disp: 8 patch, Rfl: 0   ondansetron  (ZOFRAN -ODT) 8 MG disintegrating tablet, Take 1 tablet (8 mg total) by mouth every 8 (eight) hours as needed for nausea., Disp: 20  tablet, Rfl: 1   progesterone  (PROMETRIUM ) 100 MG capsule, Take 200 mg by mouth at bedtime., Disp: , Rfl:    topiramate  (TOPAMAX ) 50 MG tablet, ONE HALF TAB BY MOUTH DAILY FOR A WEEK, THEN ONE TAB BY MOUTH DAILY., Disp: 30 tablet, Rfl: 1   trimethoprim -polymyxin b  (POLYTRIM ) ophthalmic solution, Place 1 drop into both eyes every 4 (four) hours., Disp: 10 mL, Rfl: 0   WEGOVY  0.25 MG/0.5ML SOAJ, Inject 0.25 mg into the skin once a week. Use this dose for 1 month (4 shots) and then increase to next higher dose., Disp: 2 mL, Rfl: 0   Medications ordered in this encounter:  No orders of the defined types were placed in this encounter.    *If you need refills on other medications prior to your next appointment, please contact your pharmacy*  Follow-Up: Call back or seek an in-person evaluation if the symptoms worsen or if the condition fails to improve as anticipated.  [facility] [phone]  Other Instructions Take antibiotic (Doxycycline ) as directed.  Increase fluids.  Get plenty of rest. Use Mucinex  for congestion. Use the Tessalon  as directed for cough. Take a daily probiotic (I recommend Align or Culturelle, but even Activia Yogurt may be beneficial).  A humidifier placed in the bedroom may offer some relief for a dry, scratchy throat of nasal irritation.  Read information below on acute bronchitis. Please call or return to clinic if symptoms are not improving.  Acute Bronchitis Bronchitis is when the airways that extend from the windpipe into the lungs get red, puffy, and  painful (inflamed). Bronchitis often causes thick spit (mucus) to develop. This leads to a cough. A cough is the most common symptom of bronchitis. In acute bronchitis, the condition usually begins suddenly and goes away over time (usually in 2 weeks). Smoking, allergies, and asthma can make bronchitis worse. Repeated episodes of bronchitis may cause more lung problems.  HOME CARE Rest. Drink enough  fluids to keep your pee (urine) clear or pale yellow (unless you need to limit fluids as told by your doctor). Only take over-the-counter or prescription medicines as told by your doctor. Avoid smoking and secondhand smoke. These can make bronchitis worse. If you are a smoker, think about using nicotine gum or skin patches. Quitting smoking will help your lungs heal faster. Reduce the chance of getting bronchitis again by: Washing your hands often. Avoiding people with cold symptoms. Trying not to touch your hands to your mouth, nose, or eyes. Follow up with your doctor as told.  GET HELP IF: Your symptoms do not improve after 1 week of treatment. Symptoms include: Cough. Fever. Coughing up thick spit. Body aches. Chest congestion. Chills. Shortness of breath. Sore throat.  GET HELP RIGHT AWAY IF:  You have an increased fever. You have chills. You have severe shortness of breath. You have bloody thick spit (sputum). You throw up (vomit) often. You lose too much body fluid (dehydration). You have a severe headache. You faint.  MAKE SURE YOU:  Understand these instructions. Will watch your condition. Will get help right away if you are not doing well or get worse. Document Released: 03/02/2008 Document Revised: 05/17/2013 Document Reviewed: 03/07/2013 [facility] Patient Information 2015 [place], [place]. This information is not intended to replace advice given to you by your health care provider. Make sure you discuss any questions you have with your health care provider.    If you have been instructed to have an in-person evaluation today at a local Urgent Care facility, please use the link below. It will take you to a list of all of our available [facility], including address, phone number and hours of operation. Please do not delay care.  [facility]  If you or a family member do not have a primary care provider, use the link below to schedule a  visit and establish care. When you choose a [facility] primary care physician or advanced practice provider, you gain a long-term partner in health. Find a Primary Care Provider  Learn more about [facility]'s in-office and virtual care options: [facility] - Get Care Now

## 2023-11-04 NOTE — Progress Notes (Signed)
 Virtual Visit Consent   Magdalena Skilton, you are scheduled for a virtual visit with a [facility] provider today. Just as with appointments in the office, your consent must be obtained to participate. Your consent will be active for this visit and any virtual visit you may have with one of our providers in the next 365 days. If you have a MyChart account, a copy of this consent can be sent to you electronically.  As this is a virtual visit, video technology does not allow for your provider to perform a traditional examination. This may limit your provider's ability to fully assess your condition. If your provider identifies any concerns that need to be evaluated in person or the need to arrange testing (such as labs, EKG, etc.), we will make arrangements to do so. Although advances in technology are sophisticated, we cannot ensure that it will always work on either your end or our end. If the connection with a video visit is poor, the visit may have to be switched to a telephone visit. With either a video or telephone visit, we are not always able to ensure that we have a secure connection.  By engaging in this virtual visit, you consent to the provision of healthcare and authorize for your insurance to be billed (if applicable) for the services provided during this visit. Depending on your insurance coverage, you may receive a charge related to this service.  I need to obtain your verbal consent now. Are you willing to proceed with your visit today? Jacee Templer has provided verbal consent on 11/04/2023 for a virtual visit (video or telephone). [name], [place]  Date: 11/04/2023 2:37 PM  Virtual Visit via Video Note   I, [name], connected with  Jeoffrey Ill  ([id number], Apr 19, 1976) on 11/04/23 at  2:30 PM EST by a video-enabled telemedicine application and verified that I am speaking with the correct person using two identifiers.  Location: Patient: Virtual Visit Location Patient:  Home Provider: Virtual Visit Location Provider: Home Office   I discussed the limitations of evaluation and management by telemedicine and the availability of in person appointments. The patient expressed understanding and agreed to proceed.    History of Present Illness: Anagha Loseke is a 48 y.o. who identifies as a female who was assigned female at birth, and is being seen today for  a week or so of URI symptoms including sore throat, chest congestion, cough that was dry, then productive of clear sputum. In past 2 days has worsened, now with thick yellow-green sputum.   OTC -- Advil  Cold and Sinus.   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   Amenorrhea 01/03/2020   Arthralgia 01/03/2020   Overweight 11/11/2018   Elevated blood pressure reading 11/11/2018   Granuloma of great toe 07/23/2018   Bilateral plantar fasciitis 07/23/2018   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   Ingrown toenail of both feet 07/23/2018   S/P cesarean section - indication: Vasa Previa 5/10 02/05/2018  Maternal anemia, with delivery 02/05/2018   Postpartum care following cesarean delivery 02/04/2018   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   Anxiety as acute reaction to exceptional stress 12/17/2014   Sebaceous cyst 05/15/2014   Attention deficit hyperactivity disorder (ADHD), 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    Allergies:  Allergies  Allergen Reactions   Percocet [Oxycodone -Acetaminophen ] Itching, Palpitations and Rash     Pt states she can take Acetaminophen    Sulfa Antibiotics Hives and Rash   Vicodin [Hydrocodone-Acetaminophen ] Palpitations and Rash   Medications:  Current Outpatient Medications:    benzonatate  (TESSALON ) 100 MG capsule, Take 1 capsule (100 mg total) by mouth 3 (three) times daily as needed for cough., Disp: 30 capsule, Rfl: 0   doxycycline  (VIBRA -TABS) 100 MG tablet, Take 1 tablet (100 mg total) by mouth 2 (two) times daily., Disp: 14 tablet, Rfl: 0   AMBULATORY NON FORMULARY MEDICATION, Semaglutide  2.5mg  with pyridoxine 10mg  per mL  Inject .3mg  subcutaneously once weekly for 4 weeks then increase to next dose, Disp: 2 mL, Rfl: 0   AMBULATORY NON FORMULARY MEDICATION, Semaglutide  2.5mg  with pyridoxine 10mg  per mL Inject .6mg  once weekly, Disp: 2 mL, Rfl: 1   amphetamine -dextroamphetamine  (ADDERALL) 20 MG tablet, Take 1 tablet (20 mg total) by mouth 3 (three) times daily., Disp: 90 tablet, Rfl: 0   amphetamine -dextroamphetamine  (ADDERALL) 20 MG tablet, Take 1 tablet (20 mg total) by mouth 3 (three) times daily., Disp: 90 tablet, Rfl: 0   [START ON 11/19/2023] amphetamine -dextroamphetamine  (ADDERALL) 20 MG tablet, Take 1 tablet (20 mg total) by mouth 3 (three) times daily., Disp: 90 tablet, Rfl: 0   cyclobenzaprine  (FLEXERIL ) 10 MG tablet, Take 1 tablet (10 mg total) by mouth 3 (three) times daily as needed for muscle spasms., Disp: 30 tablet, Rfl: 0   estradiol  (VIVELLE -DOT) 0.05 MG/24HR patch, Place 1 patch (0.05 mg total) onto the skin 2 (two) times a week. NEEDS APPOINTMENT FOR FURTHER REFILLS., Disp: 8 patch, Rfl: 0   ondansetron  (ZOFRAN -ODT) 8 MG disintegrating tablet, Take 1 tablet (8 mg total) by mouth every 8 (eight) hours as needed for nausea., Disp: 20 tablet, Rfl: 1   progesterone  (PROMETRIUM ) 100 MG capsule, Take 200 mg by mouth at bedtime., Disp: , Rfl:    topiramate  (TOPAMAX ) 50 MG tablet, ONE HALF TAB BY MOUTH DAILY FOR A WEEK, THEN ONE TAB BY MOUTH DAILY., Disp: 30 tablet, Rfl: 1    WEGOVY  0.25 MG/0.5ML SOAJ, Inject 0.25 mg into the skin once a week. Use this dose for 1 month (4 shots) and then increase to next higher dose., Disp: 2 mL, Rfl: 0  Observations/Objective: Patient is well-developed, well-nourished in no acute distress.  Resting comfortably at home.  Head is normocephalic, atraumatic.  No labored breathing. Speech is clear and coherent with logical content.  Patient is alert and oriented at baseline.   Assessment and Plan: 1. Acute bacterial bronchitis (Primary) - benzonatate  (TESSALON ) 100 MG capsule; Take 1 capsule (100 mg total) by mouth 3 (three) times daily as needed for cough.  Dispense: 30 capsule; Refill: 0 - doxycycline  (VIBRA -TABS) 100 MG tablet; Take 1 tablet (100 mg total) by mouth 2 (two) times daily.  Dispense: 14 tablet; Refill: 0  Rx Doxycycline .  Increase fluids.  Rest.  Saline nasal spray.  Probiotic.  Mucinex  as directed.  Humidifier in bedroom. Tessalon  per orders.  Call or return to clinic if symptoms are not improving.   Follow Up Instructions: I discussed the assessment and treatment plan with the patient. The patient was  provided an opportunity to ask questions and all were answered. The patient agreed with the plan and demonstrated an understanding of the instructions.  A copy of instructions were sent to the patient via MyChart unless otherwise noted below.    The patient was advised to call back or seek an in-person evaluation if the symptoms worsen or if the condition fails to improve as anticipated.    [name], PA-C

## 2023-11-08 ENCOUNTER — Encounter: Payer: Self-pay | Admitting: Physician Assistant

## 2023-11-08 ENCOUNTER — Telehealth: Payer: No Typology Code available for payment source | Admitting: Physician Assistant

## 2023-11-08 DIAGNOSIS — R6889 Other general symptoms and signs: Secondary | ICD-10-CM

## 2023-11-08 MED ORDER — OSELTAMIVIR PHOSPHATE 75 MG PO CAPS: 75.0000 mg | ORAL_CAPSULE | Freq: Two times a day (BID) | ORAL | 0 refills | Status: AC

## 2023-11-08 NOTE — Progress Notes (Signed)
 E visit for Flu like symptoms   We are sorry that you are not feeling well.  Here is how we plan to help! Based on what you have shared with me it looks like you may have a respiratory virus that may be influenza.  Influenza or "the flu" is   an infection caused by a respiratory virus. The flu virus is highly contagious and persons who did not receive their yearly flu vaccination may "catch" the flu from close contact.  We have anti-viral medications to treat the viruses that cause this infection. They are not a "cure" and only shorten the course of the infection. These prescriptions are most effective when they are given within the first 2 days of "flu" symptoms. Antiviral medication are indicated if you have a high risk of complications from the flu. You should  also consider an antiviral medication if you are in close contact with someone who is at risk. These medications can help patients avoid complications from the flu  but have side effects that you should know. Possible side effects from Tamiflu  or oseltamivir  include nausea, vomiting, diarrhea, dizziness, headaches, eye redness, sleep problems or other respiratory symptoms. You should not take Tamiflu  if you have an allergy to oseltamivir  or any to the ingredients in Tamiflu .  Based upon your symptoms and potential risk factors I have prescribed Oseltamivir  (Tamiflu ).  It has been sent to your designated pharmacy.  You will take one 75 mg capsule orally twice a day for the next 5 days.  Typically Paxlovid is reserved for patient at high risk.  Tamiflu  will decrease the length of Flu Virus.  Recommend supportive care.   ANYONE WHO HAS FLU SYMPTOMS SHOULD: Stay home. The flu is highly contagious and going out or to work exposes others! Be sure to drink plenty of fluids. Water is fine as well as fruit juices, sodas and electrolyte beverages. You may want to stay away from caffeine or alcohol. If you are nauseated, try taking small sips of  liquids. How do you know if you are getting enough fluid? Your urine should be a pale yellow or almost colorless. Get rest. Taking a steamy shower or using a humidifier may help nasal congestion and ease sore throat pain. Using a saline nasal spray works much the same way. Cough drops, hard candies and sore throat lozenges may ease your cough. Line up a caregiver. Have someone check on you regularly.   GET HELP RIGHT AWAY IF: You cannot keep down liquids or your medications. You become short of breath Your fell like you are going to pass out or loose consciousness. Your symptoms persist after you have completed your treatment plan MAKE SURE YOU  Understand these instructions. Will watch your condition. Will get help right away if you are not doing well or get worse.  Your e-visit answers were reviewed by a board certified advanced clinical practitioner to complete your personal care plan.  Depending on the condition, your plan could have included both over the counter or prescription medications.  If there is a problem please reply  once you have received a response from your provider.  Your safety is important to us .  If you have drug allergies check your prescription carefully.    You can use MyChart to ask questions about today's visit, request a non-urgent call back, or ask for a work or school excuse for 24 hours related to this e-Visit. If it has been greater than 24 hours you will need  to follow up with your provider, or enter a new e-Visit to address those concerns.  You will get an e-mail in the next two days asking about your experience.  I hope that your e-visit has been valuable and will speed your recovery. Thank you for using e-visits.   I have spent 5 minutes in review of e-visit questionnaire, review and updating patient chart, medical decision making and response to patient.   Char Common Ward, PA-C

## 2023-11-29 ENCOUNTER — Ambulatory Visit: Payer: Self-pay | Admitting: Physician Assistant

## 2023-11-29 NOTE — Telephone Encounter (Signed)
  Chief Complaint: Ankle injury Symptoms: Ankle swelling, pain, bruising Frequency: Constant Pertinent Negatives: Patient denies Fever, immobility, laceration, redness Disposition: [] ED /[] Urgent Care (no appt availability in office) / [x] Appointment(In office/virtual)/ []  [facility]/ [] Home Care/ [] Refused Recommended Disposition /[] [facility]/ []  Follow-up with PCP Additional Notes: Patient called with complaints of twisting ankle after missing a step while descending stairs. Patient states that the right ankle is affected and is severely swollen with bruising but she is able to walk after taking motrin. Patient states she feels some numbness when turning her ankle a certain direction before returning but could not articulate which direction, "I don't know, it just loses all feeling. Well not like all feeling but whatever is associated with the achilles." Patient states pain is moderate and states, "I guess. I mean I can walk now," when asked if taking motrin helped with her symptoms. Patient was a difficult patient as she would not provide definitive answers to a lot of the questions. Patient advised by this RN to be seen within 24 hours to which patient as agreeable. Patient advised by this RN to call back with worsening symptoms. Patient verbalized understanding   Copied from CRM [id number]. Topic: Clinical - Red Word Triage >> Nov 29, 2023 12:38 PM Nila Nephew wrote: Red Word that prompted transfer to Nurse Triage: Larey Seat down steps and rolled right ankle. Reason for Disposition  [1] Limp when walking AND [2] due to a twisted ankle or foot  Answer Assessment - Initial Assessment Questions 1. MECHANISM: "How did the injury happen?" (e.g., twisting injury, direct blow)      Walking down stairs and missed a step, twisted ankle 2. ONSET: "When did the injury happen?" (Minutes or hours ago)      Last night 3. LOCATION: "Where is the injury located?"      Right 4. APPEARANCE of  INJURY: "What does the injury look like?"      Swollen ankle, bruised 5. WEIGHT-BEARING: "Can you put weight on that foot?" "Can you walk (four steps or more)?"       "I can walk, but it hurts." 6. SIZE: For cuts, bruises, or swelling, ask: "How large is it?" (e.g., inches or centimeters;  entire joint)      Entire joint 7. PAIN: "Is there pain?" If Yes, ask: "How bad is the pain?"    (e.g., Scale 1-10; or mild, moderate, severe)   - NONE (0): no pain.   - MILD (1-3): doesn't interfere with normal activities.    - MODERATE (4-7): interferes with normal activities (e.g., work or school) or awakens from sleep, limping.    - SEVERE (8-10): excruciating pain, unable to do any normal activities, unable to walk.      Moderate 9. OTHER SYMPTOMS: "Do you have any other symptoms?"      Denies  Protocols used: Ankle and Foot Injury-A-AH

## 2023-11-30 ENCOUNTER — Encounter: Payer: Self-pay | Admitting: Physician Assistant

## 2023-11-30 ENCOUNTER — Ambulatory Visit (INDEPENDENT_AMBULATORY_CARE_PROVIDER_SITE_OTHER): Admitting: Physician Assistant

## 2023-11-30 ENCOUNTER — Ambulatory Visit

## 2023-11-30 VITALS — BP 118/80 | HR 93 | Ht 69.0 in | Wt 193.8 lb

## 2023-11-30 DIAGNOSIS — S99911A Unspecified injury of right ankle, initial encounter: Secondary | ICD-10-CM

## 2023-11-30 DIAGNOSIS — M25571 Pain in right ankle and joints of right foot: Secondary | ICD-10-CM

## 2023-11-30 DIAGNOSIS — R9389 Abnormal findings on diagnostic imaging of other specified body structures: Secondary | ICD-10-CM

## 2023-11-30 NOTE — Patient Instructions (Addendum)
 Ankle Sprain  An ankle sprain is a stretch or tear in a ligament in your ankle. Ligaments are tissues that connect bones to each other.  An ankle sprain can happen when: The ankle rolls outward. This is called an inversion sprain. The ankle rolls inward. This is called an eversion sprain. What are the causes? An ankle sprain is caused by rolling or twisting your ankle. What increases the risk? You are more likely to get an ankle sprain if you play sports. What are the signs or symptoms?  Pain in your ankle. Swelling. Bruising. Bruises may form right after you sprain your ankle or 1-2 days later. Trouble standing or walking. How is this treated? An ankle sprain may be treated with: A brace or splint. This is used to keep the ankle from moving until it heals. An elastic bandage (dressing). This is used to support the ankle. Crutches. Pain medicine. Surgery. This may be needed if the sprain is very bad. Physical therapy. This can help you move your ankle better. Follow these instructions at home: If you have a brace or a splint that can be taken off: Wear the brace or splint as told by your doctor. Take it off only as told by your doctor. Check the skin around the brace or splint every day. Tell your doctor if you see problems. Loosen the brace or splint if your toes: Tingle. Become numb. Turn cold and blue. Keep the brace or splint clean and dry. If the brace or splint is not waterproof: Do not let it get wet. Cover it with a watertight covering when you take a bath or a shower. If you have an elastic dressing: Take it off to shower or bathe. Adjust it if it feels too tight. Loosen the dressing if your foot: Tingles. Becomes numb. Turns cold and blue. Managing pain, stiffness, and swelling If told, put ice on the affected area. If you have a removable brace or splint, take it off as told by your doctor. Put ice in a plastic bag. Place a towel between your skin and the  bag. Leave the ice on for 20 minutes, 2-3 times a day. If your skin turns bright red, take off the ice right away to prevent skin damage. The risk of damage is higher if you cannot feel pain, heat, or cold. Move your toes often. Raise your ankle above the level of your heart while you are sitting or lying down. General instructions Take over-the-counter and prescription medicines only as told by your doctor. Do not smoke or use any products that contain nicotine or tobacco. If you need help quitting, ask your doctor. Rest your ankle. Use crutches to support your body weight. Do not use your injured leg to support your body weight until your doctor says that you can. Ask your doctor when it is safe to drive if you have a brace or splint on your ankle. Contact a doctor if: Your bruises or swelling get worse all of a sudden. Your pain does not get better after you take medicine. Get help right away if: Your foot or toes are numb or blue. You have very bad pain that gets worse. This information is not intended to replace advice given to you by your health care provider. Make sure you discuss any questions you have with your health care provider. Document Revised: 06/17/2022 Document Reviewed: 06/17/2022 Elsevier Patient Education  2024 [employer]  Ankle Sprain, Phase I Rehab An ankle sprain is an injury  to the tissues that connect bone to bone (ligaments) in your ankle. Ankle sprains can cause stiffness, loss of motion, and loss of strength. Ask your health care provider which exercises are safe for you. Do exercises exactly as told by your provider and adjust them as directed. It is normal to feel mild stretching, pulling, tightness, or discomfort as you do these exercises. Stop right away if you feel sudden pain or your pain gets worse. Do not begin these exercises until told by your provider. Stretching and range-of-motion exercises These exercises warm up your muscles and joints. They can  improve the movement and flexibility of your lower leg and ankle. They also help to relieve pain and stiffness. Gastroc and soleus stretch This exercise is also called a calf stretch. It stretches the muscles in the back of the lower leg. These muscles are the gastrocnemius, or gastroc, and the soleus. Sit on the floor with your left / right leg extended. Loop a belt or towel around the ball of your left / right foot. The ball of your foot is on the walking surface, right under your toes. Keep your left / right ankle and foot relaxed and keep your knee straight. Use the belt or towel to pull your foot toward you. You should feel a gentle stretch behind your calf or knee in your gastroc muscle. Hold this position for __________ seconds, then release to the starting position. Repeat the exercise with your knee bent. You can put a pillow or a rolled bath towel under your knee to support it. You should feel a stretch deep in your calf in the soleus muscle or at your Achilles tendon. Repeat __________ times. Complete this exercise __________ times a day. Ankle alphabet  Sit with your left / right leg supported at the lower leg. Do not rest your foot on anything. Make sure your foot has room to move freely. Think of your left / right foot as a paintbrush. Move your foot to trace each letter of the alphabet in the air. Keep your hip and knee still while you trace. Make the letters as large as you can without feeling discomfort. Trace every letter from A to Z. Repeat __________ times. Complete this exercise __________ times a day. Strengthening exercises These exercises build strength and endurance in your ankle and lower leg. Endurance is the ability to use your muscles for a long time, even after they get tired. Ankle dorsiflexion  Secure a rubber exercise band or tube to an object, such as a table leg, that will stay still when the band is pulled. Secure the other end around your left / right  foot. Sit on the floor facing the object, with your left / right leg extended. The band or tube should be slightly tense when your foot is relaxed. Slowly bring your foot toward you, bringing the top of your foot toward your shin (dorsiflexion), and pulling the band tighter. Hold this position for __________ seconds. Slowly return your foot to the starting position. Repeat __________ times. Complete this exercise __________ times a day. Ankle plantar flexion  Sit on the floor with your left / right leg extended. Loop a rubber exercise tube or band around the ball of your left / right foot. The ball of your foot is on the walking surface, right under your toes. Hold the ends of the band or tube in your hands. The band or tube should be slightly tense when your foot is relaxed. Slowly point your  foot and toes downward to tilt the top of your foot away from your shin (plantar flexion). Hold this position for __________ seconds. Slowly return your foot to the starting position. Repeat __________ times. Complete this exercise __________ times a day. Ankle eversion  Sit on the floor with your legs straight out in front of you. Loop a rubber exercise band or tube around the ball of your left / right foot. The ball of your foot is on the walking surface, right under your toes. Hold the ends of the band in your hands or secure the band to a stable object. The band or tube should be slightly tense when your foot is relaxed. Slowly push your foot outward, away from your other leg (eversion). Hold this position for __________ seconds. Slowly return your foot to the starting position. Repeat __________ times. Complete this exercise __________ times a day. This information is not intended to replace advice given to you by your health care provider. Make sure you discuss any questions you have with your health care provider. Document Revised: 07/08/2022 Document Reviewed: 07/08/2022 Elsevier Patient  Education  2024 [employer].

## 2023-11-30 NOTE — Progress Notes (Signed)
 There is abnormal lucency along the tibia and recommended CT of ankle which has been ordered. Imaging will call and schedule.

## 2023-11-30 NOTE — Progress Notes (Signed)
 Acute Office Visit  Subjective:     Patient ID: Haley Church, female    DOB: Nov 16, 1975, 48 y.o.   MRN: [id number]  CC: ankle pain   HPI Patient is a 48 yo female who presents with a chief complaint of right ankle pain since Friday, 5 days ago.  She states she missed a step on her deck and landed on her both of her ankles wrong. She believes that she inverted both ankles. She has sprained her right ankle multiple times. She states that if she turns a certain way she can't stand on right ankle. The left ankle is doing much better. She has been wearing an ankle brace. She continues to have swelling and pain. She is most worried about right ankle giving way when she bears weight on it.   .. Active Ambulatory Problems    Diagnosis Date Noted   Attention deficit hyperactivity disorder (ADHD), 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   Sebaceous cyst 05/15/2014   Anxiety as acute reaction to exceptional stress 12/17/2014   Postpartum care following cesarean delivery 02/04/2018   S/P cesarean section - indication: Vasa Previa 5/10 02/05/2018   Maternal anemia, with delivery 02/05/2018   Granuloma of great toe 07/23/2018   Bilateral plantar fasciitis 07/23/2018   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   Ingrown toenail of both feet 07/23/2018   Overweight 11/11/2018   Elevated blood pressure reading 11/11/2018   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   Amenorrhea 01/03/2020   Arthralgia 01/03/2020   Menopause syndrome 04/09/2021   Post-menopausal 04/12/2021   Class 1 obesity due to excess calories without serious comorbidity with body mass index (BMI) of 32.0 to 32.9 in adult 05/23/2021   Abnormal weight gain 05/23/2021   Menopausal symptoms 02/05/2023   Neck pain on left side 03/12/2023   Abnormal urine odor 05/18/2023   Class 1 obesity due to excess calories without serious comorbidity with body mass index (BMI) of 31.0 to 31.9 in adult 05/18/2023   Injury of right ankle 11/30/2023   Pain in  lateral portion of right ankle 12/01/2023   Resolved Ambulatory Problems    Diagnosis Date Noted   Postpartum care following vaginal delivery (6/16) 03/14/2013   Subacromial bursitis 10/03/2013   Plantar fasciitis, right 10/31/2013   Onychomycosis 11/02/2013   Biceps tendinitis on left 11/06/2013   Metatarsal fracture 10/10/2014   Depression 09/03/2015   History of miscarriage 12/10/2015   Other fatigue 02/26/2016   Missed periods 02/26/2016   Moody 02/26/2016   Gastritis 04/07/2016   Acute maxillary sinusitis 08/27/2016   Gastroenteritis 12/11/2016   Anxiety state 06/30/2017   Class 1 obesity due to excess calories without serious comorbidity with body mass index (BMI) of 30.0 to 30.9 in adult 06/23/2018   Past Medical History:  Diagnosis Date   AMA (advanced maternal age) multigravida 35+    Anxiety    MTHFR mutation     Review of Systems  Musculoskeletal:  Positive for joint pain.       Ankle pain  All other systems reviewed and are negative.     Objective:    BP 118/80 (BP Location: Left Arm, Patient Position: Sitting, Cuff Size: Large)   Pulse 93   Ht 5\' 9"  (1.753 m)   Wt 193 lb 12 oz (87.9 kg)   LMP 11/04/2015   SpO2 97%   BMI 28.61 kg/m  BP Readings from Last 3 Encounters:  11/30/23 118/80  05/18/23 124/88  03/12/23 137/86   Wt Readings from Last 3 Encounters:  11/30/23 193 lb 12 oz (87.9 kg)  05/18/23 213 lb (96.6 kg)  03/12/23 211 lb 8 oz (95.9 kg)    Physical Exam Constitutional:      Appearance: Normal appearance.  HENT:     Head: Normocephalic and atraumatic.  Eyes:     Extraocular Movements: Extraocular movements intact.  Cardiovascular:     Rate and Rhythm: Normal rate and regular rhythm.     Pulses: Normal pulses.     Heart sounds: Normal heart sounds.  Pulmonary:     Effort: Pulmonary effort is normal.     Breath sounds: Normal breath sounds.  Musculoskeletal:        General: Normal range of motion.     Cervical back: Normal range  of motion.  Feet:     Comments: Hurts with inversion and eversion Full active ROM of the right ankle Point tenderness on the right lateral malleolus Skin:    General: Skin is warm.  Neurological:     Mental Status: She is alert.  Psychiatric:        Mood and Affect: Mood normal.        Behavior: Behavior normal.       Assessment & Plan:  Marland KitchenMarland KitchenAmber was seen today for ankle injury.  Diagnoses and all orders for this visit:  Injury of right ankle, initial encounter -     DG Ankle Complete Right; Future -     CT ANKLE RIGHT WO CONTRAST; Future  Pain in lateral portion of right ankle -     DG Ankle Complete Right; Future -     CT ANKLE RIGHT WO CONTRAST; Future  Abnormal x-ray -     CT ANKLE RIGHT WO CONTRAST; Future    Sprain/tear of lateral ankle ligaments vs concern for avulsion fracture of malleolus. Xray ordered.  Xray does show some lucency per report on the distal tibia however I see lucency on the distal fibula.  CT ordered to rule out fracture.  Discussed Rest, Ice, Compression, NSAIDs, elevation Placed in ASO brace today, use crutches if needed due to pain with weight bearing.  Stay in brace for 4 weeks pending CT further recommendations will be made.    Tandy Gaw, PA-C

## 2023-12-01 ENCOUNTER — Ambulatory Visit

## 2023-12-01 ENCOUNTER — Encounter: Payer: Self-pay | Admitting: Physician Assistant

## 2023-12-01 DIAGNOSIS — M25571 Pain in right ankle and joints of right foot: Secondary | ICD-10-CM

## 2023-12-01 DIAGNOSIS — S99911A Unspecified injury of right ankle, initial encounter: Secondary | ICD-10-CM | POA: Diagnosis not present

## 2023-12-01 DIAGNOSIS — R9389 Abnormal findings on diagnostic imaging of other specified body structures: Secondary | ICD-10-CM

## 2023-12-02 ENCOUNTER — Encounter: Payer: Self-pay | Admitting: Physician Assistant

## 2023-12-02 ENCOUNTER — Ambulatory Visit

## 2023-12-02 ENCOUNTER — Telehealth: Payer: Self-pay

## 2023-12-02 NOTE — Telephone Encounter (Signed)
 Patient scheduled for nurse visit

## 2023-12-02 NOTE — Progress Notes (Signed)
 You do have a non-displaced posterior malleolar fracture and need to be moved to a cam boot. Come in for nurse visit. You will likely need to wear for 6 weeks. Follow up with sports medicine in 2-3 weeks after wearing cam boot. Come in for nurse visit ASAP for cam boot.

## 2023-12-02 NOTE — Telephone Encounter (Signed)
 Patient scheduled for nurse visit today for CAM boot placement =kph

## 2023-12-02 NOTE — Telephone Encounter (Signed)
 Copied from CRM [id number]. Topic: Clinical - Lab/Test Results >> Dec 01, 2023  1:52 PM Whitney O wrote: Reason for CRM: patient is calling for ct scan results . Patient is needing results .  [phone]

## 2023-12-03 ENCOUNTER — Ambulatory Visit (INDEPENDENT_AMBULATORY_CARE_PROVIDER_SITE_OTHER)

## 2023-12-03 VITALS — BP 118/80 | Ht 69.0 in | Wt 193.8 lb

## 2023-12-03 DIAGNOSIS — M25571 Pain in right ankle and joints of right foot: Secondary | ICD-10-CM

## 2023-12-03 NOTE — Progress Notes (Signed)
 Pt presented for fitting of cam boot. Fitting completed. Documentation completed. Patient aware of how to wear and operate boot.

## 2023-12-15 ENCOUNTER — Encounter: Payer: Self-pay | Admitting: Physician Assistant

## 2023-12-15 ENCOUNTER — Ambulatory Visit (INDEPENDENT_AMBULATORY_CARE_PROVIDER_SITE_OTHER): Admitting: Physician Assistant

## 2023-12-15 VITALS — BP 136/82 | HR 112 | Ht 69.0 in | Wt 185.0 lb

## 2023-12-15 DIAGNOSIS — S82891D Other fracture of right lower leg, subsequent encounter for closed fracture with routine healing: Secondary | ICD-10-CM

## 2023-12-15 DIAGNOSIS — R8290 Unspecified abnormal findings in urine: Secondary | ICD-10-CM

## 2023-12-15 DIAGNOSIS — N3001 Acute cystitis with hematuria: Secondary | ICD-10-CM

## 2023-12-15 DIAGNOSIS — F900 Attention-deficit hyperactivity disorder, predominantly inattentive type: Secondary | ICD-10-CM

## 2023-12-15 DIAGNOSIS — R110 Nausea: Secondary | ICD-10-CM

## 2023-12-15 DIAGNOSIS — Z8639 Personal history of other endocrine, nutritional and metabolic disease: Secondary | ICD-10-CM

## 2023-12-15 DIAGNOSIS — S82891A Other fracture of right lower leg, initial encounter for closed fracture: Secondary | ICD-10-CM | POA: Insufficient documentation

## 2023-12-15 DIAGNOSIS — E663 Overweight: Secondary | ICD-10-CM

## 2023-12-15 LAB — POCT URINALYSIS DIP (CLINITEK)
Glucose, UA: NEGATIVE mg/dL
Ketones, POC UA: NEGATIVE mg/dL
Nitrite, UA: NEGATIVE
POC PROTEIN,UA: 30 — AB
Spec Grav, UA: >=1.03 — AB (ref 1.010–1.025)
Urobilinogen, UA: 0.2 U/dL
pH, UA: 6 (ref 5.0–8.0)

## 2023-12-15 MED ORDER — ONDANSETRON 8 MG PO TBDP: 8.0000 mg | ORAL_TABLET | Freq: Three times a day (TID) | ORAL | 1 refills | Status: AC | PRN

## 2023-12-15 MED ORDER — AMPHETAMINE-DEXTROAMPHETAMINE 20 MG PO TABS: 20.0000 mg | ORAL_TABLET | Freq: Three times a day (TID) | ORAL | 0 refills | Status: DC

## 2023-12-15 MED ORDER — NITROFURANTOIN MONOHYD MACRO 100 MG PO CAPS: 100.0000 mg | ORAL_CAPSULE | Freq: Two times a day (BID) | ORAL | 0 refills | Status: DC

## 2023-12-15 MED ORDER — TIRZEPATIDE 5 MG/0.5ML ~~LOC~~ SOAJ: 5.0000 mg | SUBCUTANEOUS | Status: DC

## 2023-12-15 NOTE — Patient Instructions (Addendum)
 Stay in boot for 4 more weeks.  Then get xray and follow up in office to go over  Zofran and adderall sent to pharmacy

## 2023-12-15 NOTE — Progress Notes (Unsigned)
 Established Patient Office Visit  Subjective   Patient ID: Haley Church, female    DOB: [birth year]  Age: 48 y.o. MRN: [id number]  Chief Complaint  Patient presents with   Ankle Pain    HPI Pt is a 48 yo female who presents to the clinic for 2 week follow up on ankle fracture.   She is in a cam boot and doing well. No pain when bearing weight. She comes out of it and does ROM exercises with her feet and doing well.    IMPRESSION: 1. Subtle linear lucency within the coronal plane of the distal posterior tibia, a nondisplaced posterior malleolar intra-articular fracture. 2. Small to moderate tibiotalar joint effusion. 3. Mild to moderate hallux valgus. 4. Moderate plantar calcaneal heel spur.    She is having bad odor to urine for the last week and some discomfort. No fever, chills, flank or abdominal pain.   She is on tirzepatide and doing well. She gets online. Requesting zofran to use as needed with.   ADD doing well. Needs refills. No concerns.  .. Active Ambulatory Problems    Diagnosis Date Noted   Attention deficit hyperactivity disorder (ADHD), 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   Sebaceous cyst 05/15/2014   Anxiety as acute reaction to exceptional stress 12/17/2014   Postpartum care following cesarean delivery 02/04/2018   S/P cesarean section - indication: Vasa Previa 5/10 02/05/2018   Maternal anemia, with delivery 02/05/2018   Granuloma of great toe 07/23/2018   Bilateral plantar fasciitis 07/23/2018   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   Ingrown toenail of both feet 07/23/2018   Overweight 11/11/2018   Elevated blood pressure reading 11/11/2018   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   Amenorrhea 01/03/2020   Arthralgia 01/03/2020   Menopause syndrome 04/09/2021   Post-menopausal 04/12/2021   Class 1 obesity due to excess calories without serious comorbidity with body mass index (BMI) of 32.0 to 32.9 in adult 05/23/2021   Abnormal weight gain 05/23/2021    Menopausal symptoms 02/05/2023   Neck pain on left side 03/12/2023   Abnormal urine odor 05/18/2023   Class 1 obesity due to excess calories without serious comorbidity with body mass index (BMI) of 31.0 to 31.9 in adult 05/18/2023   Injury of right ankle 11/30/2023   Pain in lateral portion of right ankle 12/01/2023   Closed fracture of right ankle 12/15/2023   Bad odor of urine 12/15/2023   Resolved Ambulatory Problems    Diagnosis Date Noted   Postpartum care following vaginal delivery (6/16) 03/14/2013   Subacromial bursitis 10/03/2013   Plantar fasciitis, right 10/31/2013   Onychomycosis 11/02/2013   Biceps tendinitis on left 11/06/2013   Metatarsal fracture 10/10/2014   Depression 09/03/2015   History of miscarriage 12/10/2015   Other fatigue 02/26/2016   Missed periods 02/26/2016   Moody 02/26/2016   Gastritis 04/07/2016   Acute maxillary sinusitis 08/27/2016   Gastroenteritis 12/11/2016   Anxiety state 06/30/2017   Class 1 obesity due to excess calories without serious comorbidity with body mass index (BMI) of 30.0 to 30.9 in adult 06/23/2018   Past Medical History:  Diagnosis Date   AMA (advanced maternal age) multigravida 35+    Anxiety    MTHFR mutation      ROS   See HPI.  Objective:     BP 136/82   Pulse (!) 112   Ht 5\' 9"  (1.753 m)   Wt 185 lb (83.9 kg)   LMP 11/04/2015   SpO2 99%   BMI 27.32  kg/m  BP Readings from Last 3 Encounters:  12/15/23 136/82  12/03/23 118/80  11/30/23 118/80   Wt Readings from Last 3 Encounters:  12/15/23 185 lb (83.9 kg)  12/03/23 193 lb 12 oz (87.9 kg)  11/30/23 193 lb 12 oz (87.9 kg)    .[name] Results for orders placed or performed in visit on 12/15/23  POCT URINALYSIS DIP (CLINITEK)   Collection Time: 12/15/23 11:10 AM  Result Value Ref Range   Color, UA yellow yellow   Clarity, UA clear clear   Glucose, UA negative negative mg/dL   Bilirubin, UA small (A) negative   Ketones, POC UA negative negative mg/dL    Spec Grav, UA >=0.102 (A) 1.010 - 1.025   Blood, UA trace-lysed (A) negative   pH, UA 6.0 5.0 - 8.0   POC PROTEIN,UA =30 (A) negative, trace   Urobilinogen, UA 0.2 0.2 or 1.0 E.U./dL   Nitrite, UA Negative Negative   Leukocytes, UA Small (1+) (A) Negative     Physical Exam Constitutional:      Appearance: Normal appearance.  HENT:     Head: Normocephalic.  Cardiovascular:     Rate and Rhythm: Normal rate and regular rhythm.  Pulmonary:     Effort: Pulmonary effort is normal.     Breath sounds: Normal breath sounds.  Musculoskeletal:     Comments: Right lateral ankle with residual edema over the malleolus.  Bruising resolved.  Tenderness over medial malleolus to touch and surrounding tendons ROM full and without pain   Neurological:     General: No focal deficit present.     Mental Status: She is alert and oriented to person, place, and time.  Psychiatric:        Mood and Affect: Mood normal.       The 10-year ASCVD risk score (Arnett DK, et al., 2019) is: 1.2%    Assessment & Plan:  Marland KitchenMarland KitchenAmber was seen today for ankle pain.  Diagnoses and all orders for this visit:  Closed fracture of right ankle with routine healing, subsequent encounter -     DG Foot Complete Right; Future  Attention deficit hyperactivity disorder (ADHD), predominantly inattentive type -     amphetamine-dextroamphetamine (ADDERALL) 20 MG tablet; Take 1 tablet (20 mg total) by mouth 3 (three) times daily. -     amphetamine-dextroamphetamine (ADDERALL) 20 MG tablet; Take 1 tablet (20 mg total) by mouth 3 (three) times daily. -     amphetamine-dextroamphetamine (ADDERALL) 20 MG tablet; Take 1 tablet (20 mg total) by mouth 3 (three) times daily. -     DG Ankle Complete Right; Future  Bad odor of urine -     POCT URINALYSIS DIP (CLINITEK) -     Urine Culture -     nitrofurantoin, macrocrystal-monohydrate, (MACROBID) 100 MG capsule; Take 1 capsule (100 mg total) by mouth 2 (two) times daily.  Acute  cystitis with hematuria -     nitrofurantoin, macrocrystal-monohydrate, (MACROBID) 100 MG capsule; Take 1 capsule (100 mg total) by mouth 2 (two) times daily.  Nausea -     ondansetron (ZOFRAN-ODT) 8 MG disintegrating tablet; Take 1 tablet (8 mg total) by mouth every 8 (eight) hours as needed for nausea.  History of obesity -     tirzepatide (MOUNJARO) 5 MG/0.5ML Pen; Inject 5 mg into the skin once a week.  Overweight (BMI 25.0-29.9) -     tirzepatide (MOUNJARO) 5 MG/0.5ML Pen; Inject 5 mg into the skin once a week.   UA showed  leuks/protein/blood Sent macrobid to start Will culture Drink plenty of water Follow up if not improving or symptoms worsening  Refilled zofran to use with tirzepatide Continue to work on diet   Refilled Adderall  Will need to wear boot another 4 weeks  Will get xray then and follow up  Return in about 4 weeks (around 01/12/2024) for Follow up get xray before appt.    Tandy Gaw, PA-C

## 2023-12-17 ENCOUNTER — Encounter: Payer: Self-pay | Admitting: Physician Assistant

## 2023-12-19 LAB — URINE CULTURE

## 2023-12-20 ENCOUNTER — Encounter: Payer: Self-pay | Admitting: Physician Assistant

## 2023-12-20 NOTE — Progress Notes (Signed)
 UTI sensitive to macrobid. Should be feeling better. Let me know if having any new or persistent symptoms.

## 2024-03-01 ENCOUNTER — Ambulatory Visit (INDEPENDENT_AMBULATORY_CARE_PROVIDER_SITE_OTHER): Admitting: Physician Assistant

## 2024-03-01 ENCOUNTER — Other Ambulatory Visit (HOSPITAL_COMMUNITY)
Admission: RE | Admit: 2024-03-01 | Discharge: 2024-03-01 | Disposition: A | Discharge status: Home / Self Care | Source: Ambulatory Visit | Attending: Physician Assistant | Admitting: Physician Assistant

## 2024-03-01 VITALS — BP 126/78 | HR 88 | Wt 185.0 lb

## 2024-03-01 DIAGNOSIS — R5383 Other fatigue: Secondary | ICD-10-CM | POA: Diagnosis not present

## 2024-03-01 DIAGNOSIS — Z1211 Encounter for screening for malignant neoplasm of colon: Secondary | ICD-10-CM | POA: Diagnosis not present

## 2024-03-01 DIAGNOSIS — R0602 Shortness of breath: Secondary | ICD-10-CM | POA: Diagnosis not present

## 2024-03-01 DIAGNOSIS — Z124 Encounter for screening for malignant neoplasm of cervix: Secondary | ICD-10-CM | POA: Insufficient documentation

## 2024-03-01 DIAGNOSIS — N950 Postmenopausal bleeding: Secondary | ICD-10-CM | POA: Diagnosis not present

## 2024-03-01 NOTE — Patient Instructions (Signed)
 Will schedule pelvic ultrasound.

## 2024-03-01 NOTE — Progress Notes (Signed)
   Established Patient Office Visit  Subjective   Patient ID: Haley Church, female    DOB: Feb 21, 1976  Age: 48 y.o. MRN: [id number]  Chief Complaint  Patient presents with   Medical Management of Chronic Issues    fatigue. wants blood work    HPI Pt is a 48 yo female who presents to the clinic to get labs for fatigue. She feels more worn out for the past few weeks and wants labs checked. She is easily out of breath doing little task. She has not changed any medication. She is sleeping well. She denies any URI symptoms. She is having some post menopausal bleeding intermittently. No vaginal itching or odor. Not had a pap smear in years. No painful intercourse.    ROS See    Objective:     LMP 11/04/2015  BP Readings from Last 3 Encounters:  12/15/23 136/82  12/03/23 118/80  11/30/23 118/80   Wt Readings from Last 3 Encounters:  12/15/23 185 lb (83.9 kg)  12/03/23 193 lb 12 oz (87.9 kg)  11/30/23 193 lb 12 oz (87.9 kg)      Physical Exam Constitutional:      Appearance: Normal appearance.  HENT:     Head: Normocephalic.  Cardiovascular:     Rate and Rhythm: Normal rate and regular rhythm.  Pulmonary:     Effort: Pulmonary effort is normal.     Breath sounds: Normal breath sounds. No wheezing or rhonchi.  Genitourinary:    General: Normal vulva.     Vagina: Vaginal discharge present.     Rectum: Normal. Guaiac result negative.     Comments: Thick grey to brown discharge.  No polyps or masses.  Neurological:     General: No focal deficit present.     Mental Status: She is alert and oriented to person, place, and time.  Psychiatric:        Mood and Affect: Mood normal.      The 10-year ASCVD risk score (Arnett DK, et al., 2019) is: 1.2%    Assessment & Plan:  Haley AasAaron AasAmber was seen today for medical management of chronic issues.  Diagnoses and all orders for this visit:  SOB (shortness of breath) on exertion -     CBC w/Diff/Platelet -     Fe+TIBC+Fer -      CMP14+EGFR -     TSH + free T4 -     VITAMIN D  25 Hydroxy (Vit-D Deficiency, Fractures) -     B12 and Folate Panel -     Sed Rate (ESR) -     Brain natriuretic peptide  No energy -     CBC w/Diff/Platelet -     Fe+TIBC+Fer -     CMP14+EGFR -     TSH + free T4 -     VITAMIN D  25 Hydroxy (Vit-D Deficiency, Fractures) -     B12 and Folate Panel -     Sed Rate (ESR) -     Brain natriuretic peptide  Post-menopausal bleeding -     US  Pelvic Complete With Transvaginal; Future -     Ambulatory referral to Obstetrics / Gynecology  Colon cancer screening -     Ambulatory referral to Gastroenterology  Papanicolaou smear -     Cytology - PAP   Labs ordered to evaluate fatigue.  Pelvic u/s and pap for post menopausal bleeding Colonoscopy ordered Follow up after results   Sandy Crumb, PA-C

## 2024-03-02 LAB — TSH+FREE T4
Free T4: 1.44 ng/dL (ref 0.82–1.77)
TSH: 0.791 u[IU]/mL (ref 0.450–4.500)

## 2024-03-02 LAB — CMP14+EGFR
ALT: 15 IU/L (ref 0–32)
AST: 17 IU/L (ref 0–40)
Albumin: 4.4 g/dL (ref 3.9–4.9)
Alkaline Phosphatase: 93 IU/L (ref 44–121)
BUN/Creatinine Ratio: 11 (ref 9–23)
BUN: 9 mg/dL (ref 6–24)
Bilirubin Total: 0.4 mg/dL (ref 0.0–1.2)
CO2: 20 mmol/L (ref 20–29)
Calcium: 9.6 mg/dL (ref 8.7–10.2)
Chloride: 104 mmol/L (ref 96–106)
Creatinine, Ser: 0.83 mg/dL (ref 0.57–1.00)
Globulin, Total: 2.2 g/dL (ref 1.5–4.5)
Glucose: 82 mg/dL (ref 70–99)
Potassium: 4.5 mmol/L (ref 3.5–5.2)
Sodium: 139 mmol/L (ref 134–144)
Total Protein: 6.6 g/dL (ref 6.0–8.5)
eGFR: 86 mL/min/{1.73_m2} (ref >59)

## 2024-03-02 LAB — CBC WITH DIFFERENTIAL/PLATELET
Basophils Absolute: 0 10*3/uL (ref 0.0–0.2)
Basos: 1 %
EOS (ABSOLUTE): 0.1 10*3/uL (ref 0.0–0.4)
Eos: 2 %
Hematocrit: 43.2 % (ref 34.0–46.6)
Hemoglobin: 14.2 g/dL (ref 11.1–15.9)
Immature Grans (Abs): 0 10*3/uL (ref 0.0–0.1)
Immature Granulocytes: 0 %
Lymphocytes Absolute: 2 10*3/uL (ref 0.7–3.1)
Lymphs: 43 %
MCH: 29.3 pg (ref 26.6–33.0)
MCHC: 32.9 g/dL (ref 31.5–35.7)
MCV: 89 fL (ref 79–97)
Monocytes Absolute: 0.4 10*3/uL (ref 0.1–0.9)
Monocytes: 9 %
Neutrophils Absolute: 2.1 10*3/uL (ref 1.4–7.0)
Neutrophils: 45 %
Platelets: 272 10*3/uL (ref 150–450)
RBC: 4.84 x10E6/uL (ref 3.77–5.28)
RDW: 12.9 % (ref 11.7–15.4)
WBC: 4.6 10*3/uL (ref 3.4–10.8)

## 2024-03-02 LAB — VITAMIN D 25 HYDROXY (VIT D DEFICIENCY, FRACTURES): Vit D, 25-Hydroxy: 49.4 ng/mL (ref 30.0–100.0)

## 2024-03-02 LAB — IRON,TIBC AND FERRITIN PANEL
Ferritin: 138 ng/mL (ref 15–150)
Iron Saturation: 27 % (ref 15–55)
Iron: 66 ug/dL (ref 27–159)
Total Iron Binding Capacity: 246 ug/dL — ABNORMAL LOW (ref 250–450)
UIBC: 180 ug/dL (ref 131–425)

## 2024-03-02 LAB — BRAIN NATRIURETIC PEPTIDE: BNP: 7.5 pg/mL (ref 0.0–100.0)

## 2024-03-02 LAB — SEDIMENTATION RATE: Sed Rate: 2 mm/h (ref 0–32)

## 2024-03-02 LAB — B12 AND FOLATE PANEL
Folate: 7.8 ng/mL (ref >3.0)
Vitamin B-12: 623 pg/mL (ref 232–1245)

## 2024-03-03 ENCOUNTER — Ambulatory Visit: Payer: Self-pay | Admitting: Physician Assistant

## 2024-03-03 NOTE — Progress Notes (Signed)
 Conchetta,   Hemoglobin looks good.  Kidney, liver, glucose look good.  Thyroid  looks good.  Vitamin d  looks GREAT.  B12 and folate look good.    Labs look amazing and no reason found for fatigue in labs.

## 2024-03-06 ENCOUNTER — Encounter: Payer: Self-pay | Admitting: Physician Assistant

## 2024-03-08 LAB — CYTOLOGY - PAP
Adequacy: ABSENT
Comment: NEGATIVE
Diagnosis: NEGATIVE
High risk HPV: NEGATIVE

## 2024-03-08 NOTE — Progress Notes (Signed)
 Your pap results showed negative for HPV and no abnormal cells. GREAT news. I still want you to go to GYN and referral made on 6/9. If bleeding continues need to consider biopsy.

## 2024-04-17 ENCOUNTER — Encounter: Admitting: Obstetrics & Gynecology

## 2024-08-07 ENCOUNTER — Encounter: Payer: Self-pay | Admitting: Physician Assistant

## 2024-08-07 DIAGNOSIS — F900 Attention-deficit hyperactivity disorder, predominantly inattentive type: Secondary | ICD-10-CM

## 2024-08-08 MED ORDER — AMPHETAMINE-DEXTROAMPHETAMINE 20 MG PO TABS: 20.0000 mg | ORAL_TABLET | Freq: Three times a day (TID) | ORAL | 0 refills | Status: DC

## 2024-10-31 ENCOUNTER — Encounter: Payer: Self-pay | Admitting: Physician Assistant

## 2024-10-31 ENCOUNTER — Telehealth: Admitting: Physician Assistant

## 2024-10-31 VITALS — BP 130/78

## 2024-10-31 DIAGNOSIS — F900 Attention-deficit hyperactivity disorder, predominantly inattentive type: Secondary | ICD-10-CM | POA: Diagnosis not present

## 2024-10-31 DIAGNOSIS — Z1231 Encounter for screening mammogram for malignant neoplasm of breast: Secondary | ICD-10-CM | POA: Diagnosis not present

## 2024-10-31 DIAGNOSIS — N951 Menopausal and female climacteric states: Secondary | ICD-10-CM | POA: Diagnosis not present

## 2024-10-31 DIAGNOSIS — Z1211 Encounter for screening for malignant neoplasm of colon: Secondary | ICD-10-CM

## 2024-10-31 DIAGNOSIS — F5101 Primary insomnia: Secondary | ICD-10-CM | POA: Insufficient documentation

## 2024-10-31 MED ORDER — AMPHETAMINE-DEXTROAMPHETAMINE 20 MG PO TABS: 20.0000 mg | ORAL_TABLET | Freq: Three times a day (TID) | ORAL | 0 refills | Status: AC

## 2024-10-31 MED ORDER — PROGESTERONE MICRONIZED 100 MG PO CAPS: 200.0000 mg | ORAL_CAPSULE | Freq: Every day | ORAL | 1 refills | Status: AC

## 2024-10-31 MED ORDER — ESTRADIOL 0.05 MG/24HR TD PTTW: 1.0000 | MEDICATED_PATCH | TRANSDERMAL | 1 refills | Status: AC
